# Patient Record
Sex: Male | Born: 1945
Health system: Southern US, Community
[De-identification: ages and names within clinical notes are randomized; demographics above are authoritative.]

## PROBLEM LIST (undated history)

## (undated) DIAGNOSIS — I10 Essential (primary) hypertension: Secondary | ICD-10-CM

## (undated) DIAGNOSIS — E785 Hyperlipidemia, unspecified: Secondary | ICD-10-CM

## (undated) DIAGNOSIS — N402 Nodular prostate without lower urinary tract symptoms: Secondary | ICD-10-CM

## (undated) DIAGNOSIS — K2270 Barrett's esophagus without dysplasia: Secondary | ICD-10-CM

## (undated) DIAGNOSIS — N200 Calculus of kidney: Secondary | ICD-10-CM

## (undated) DIAGNOSIS — K219 Gastro-esophageal reflux disease without esophagitis: Secondary | ICD-10-CM

## (undated) HISTORY — DX: Barrett's esophagus without dysplasia: K22.70

## (undated) HISTORY — PX: UPPER GASTROINTESTINAL ENDOSCOPY: SHX188

## (undated) HISTORY — PX: ESOPHAGOGASTRODUODENOSCOPY: SHX1529

## (undated) HISTORY — PX: COLONOSCOPY: SHX174

## (undated) HISTORY — DX: Nodular prostate without lower urinary tract symptoms: N40.2

## (undated) HISTORY — DX: Essential (primary) hypertension: I10

## (undated) HISTORY — DX: Gastro-esophageal reflux disease without esophagitis: K21.9

## (undated) HISTORY — DX: Calculus of kidney: N20.0

## (undated) HISTORY — DX: Hyperlipidemia, unspecified: E78.5

## (undated) HISTORY — PX: APPENDECTOMY: SHX54

---

## 2000-02-18 ENCOUNTER — Encounter: Payer: Self-pay | Admitting: Internal Medicine

## 2000-02-18 ENCOUNTER — Ambulatory Visit (HOSPITAL_COMMUNITY): Admission: RE | Admit: 2000-02-18 | Discharge: 2000-02-18 | Payer: Self-pay | Admitting: Gastroenterology

## 2001-10-24 ENCOUNTER — Encounter: Payer: Self-pay | Admitting: Urology

## 2001-10-24 ENCOUNTER — Emergency Department (HOSPITAL_COMMUNITY): Admission: EM | Admit: 2001-10-24 | Discharge: 2001-10-24 | Payer: Self-pay | Admitting: Emergency Medicine

## 2001-11-04 ENCOUNTER — Encounter: Payer: Self-pay | Admitting: Urology

## 2001-11-04 ENCOUNTER — Ambulatory Visit (HOSPITAL_BASED_OUTPATIENT_CLINIC_OR_DEPARTMENT_OTHER): Admission: RE | Admit: 2001-11-04 | Discharge: 2001-11-04 | Payer: Self-pay | Admitting: Urology

## 2004-01-22 ENCOUNTER — Inpatient Hospital Stay (HOSPITAL_BASED_OUTPATIENT_CLINIC_OR_DEPARTMENT_OTHER): Admission: RE | Admit: 2004-01-22 | Discharge: 2004-01-22 | Payer: Self-pay | Admitting: Cardiology

## 2004-04-05 ENCOUNTER — Encounter: Payer: Self-pay | Admitting: Gastroenterology

## 2004-04-09 ENCOUNTER — Inpatient Hospital Stay (HOSPITAL_COMMUNITY): Admission: EM | Admit: 2004-04-09 | Discharge: 2004-04-11 | Payer: Self-pay | Admitting: Emergency Medicine

## 2004-04-09 ENCOUNTER — Encounter (INDEPENDENT_AMBULATORY_CARE_PROVIDER_SITE_OTHER): Payer: Self-pay | Admitting: *Deleted

## 2004-04-09 ENCOUNTER — Ambulatory Visit: Payer: Self-pay | Admitting: Internal Medicine

## 2004-04-30 ENCOUNTER — Ambulatory Visit: Payer: Self-pay | Admitting: Gastroenterology

## 2004-09-03 ENCOUNTER — Ambulatory Visit: Payer: Self-pay | Admitting: Internal Medicine

## 2004-09-17 ENCOUNTER — Ambulatory Visit: Payer: Self-pay | Admitting: Internal Medicine

## 2005-03-26 ENCOUNTER — Ambulatory Visit: Payer: Self-pay | Admitting: Internal Medicine

## 2005-04-04 ENCOUNTER — Ambulatory Visit (HOSPITAL_BASED_OUTPATIENT_CLINIC_OR_DEPARTMENT_OTHER): Admission: RE | Admit: 2005-04-04 | Discharge: 2005-04-04 | Payer: Self-pay | Admitting: Urology

## 2005-04-04 ENCOUNTER — Ambulatory Visit (HOSPITAL_COMMUNITY): Admission: RE | Admit: 2005-04-04 | Discharge: 2005-04-04 | Payer: Self-pay | Admitting: Urology

## 2005-08-22 ENCOUNTER — Ambulatory Visit: Payer: Self-pay | Admitting: Internal Medicine

## 2006-04-10 ENCOUNTER — Ambulatory Visit: Payer: Self-pay | Admitting: Internal Medicine

## 2006-07-09 ENCOUNTER — Ambulatory Visit: Payer: Self-pay | Admitting: Internal Medicine

## 2006-07-09 LAB — CONVERTED CEMR LAB
ALT: 27 units/L (ref 0–40)
Basophils Absolute: 0 10*3/uL (ref 0.0–0.1)
Calcium: 9.4 mg/dL (ref 8.4–10.5)
Chloride: 108 meq/L (ref 96–112)
Creatinine, Ser: 0.9 mg/dL (ref 0.4–1.5)
Eosinophils Absolute: 0.1 10*3/uL (ref 0.0–0.6)
Eosinophils Relative: 1.8 % (ref 0.0–5.0)
GFR calc non Af Amer: 91 mL/min
HCT: 47 % (ref 39.0–52.0)
LDL Cholesterol: 65 mg/dL (ref 0–99)
MCHC: 33.8 g/dL (ref 30.0–36.0)
Neutrophils Relative %: 60 % (ref 43.0–77.0)
PSA: 0.79 ng/mL (ref 0.10–4.00)
Platelets: 248 10*3/uL (ref 150–400)
RBC: 5.11 M/uL (ref 4.22–5.81)
RDW: 12.4 % (ref 11.5–14.6)
Sodium: 140 meq/L (ref 135–145)
TSH: 1.18 microintl units/mL (ref 0.35–5.50)
Total CHOL/HDL Ratio: 3.5
Triglycerides: 114 mg/dL (ref 0–149)
WBC: 4.3 10*3/uL — ABNORMAL LOW (ref 4.5–10.5)

## 2006-07-31 IMAGING — CT CT PELVIS W/ CM
1 of 4 series · 14 of 32 positions shown, 19 images · IV contrast (omnipaque)
Comparison: none

CLINICAL DATA: Right lower quadrant pain.
 CT ABDOMEN AND PELVIS WITH CONTRAST ? 04/09/04 AT 4205 HOURS:
TECHNIQUE: 150 cc of Omnipaque 300.
 ABDOMEN:

[Series 3: — · axial · 0.74mm/px · z∈[-723,-288]mm · 14 of 99 slices shown, 19 images]
[im 6/99  soft-tissue]
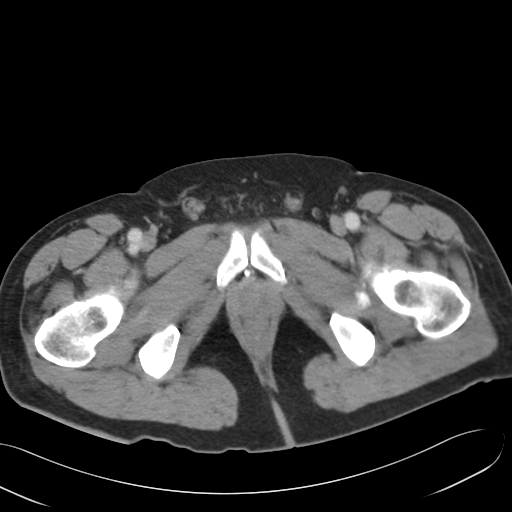
[im 6/99  bone]
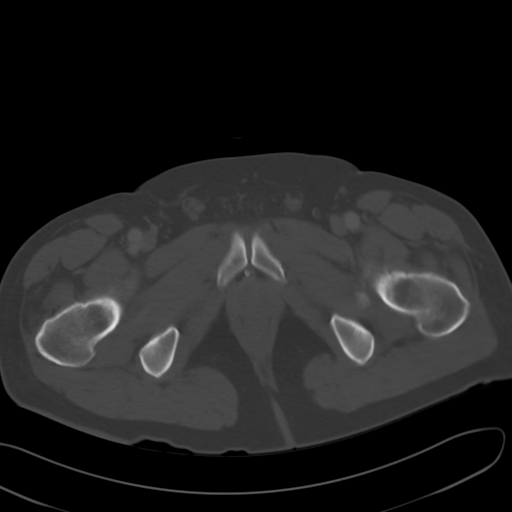
[im 16/99  soft-tissue]
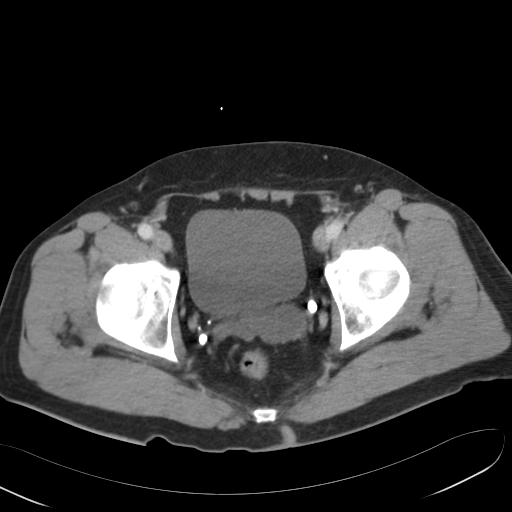
[im 21/99  soft-tissue]
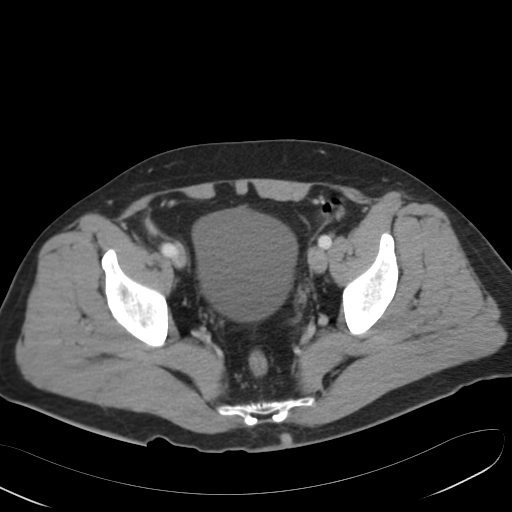
[im 26/99  soft-tissue]
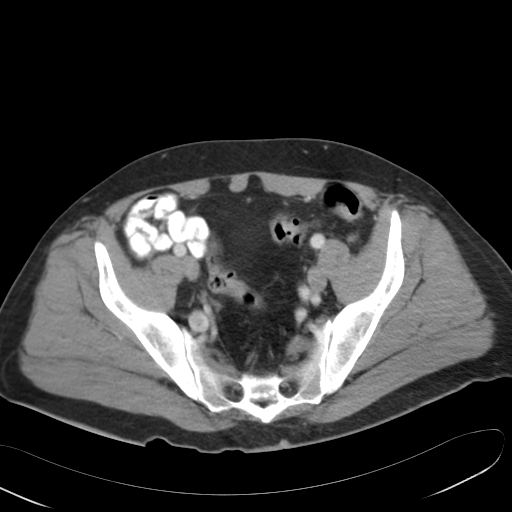
[im 37/99  soft-tissue]
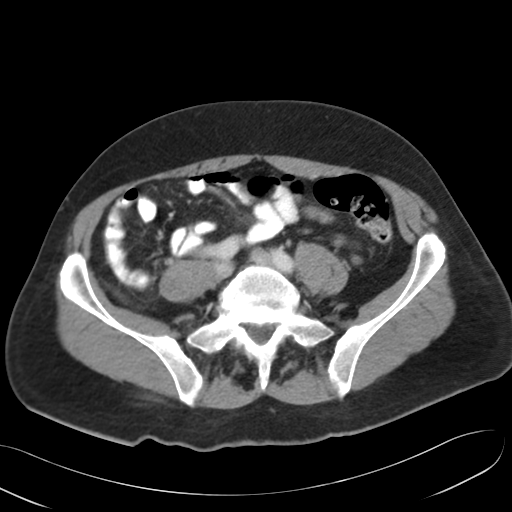
[im 42/99  soft-tissue]
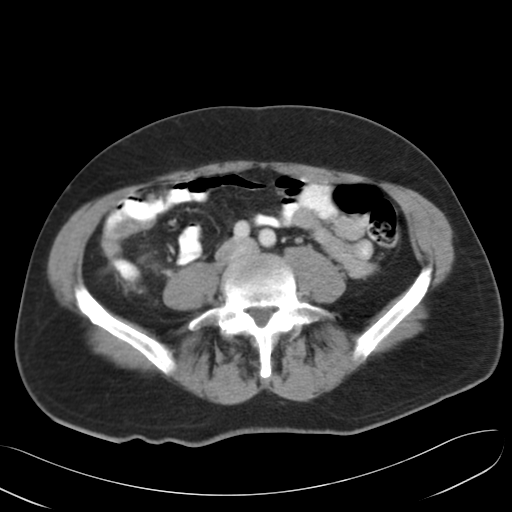
[im 52/99  soft-tissue]
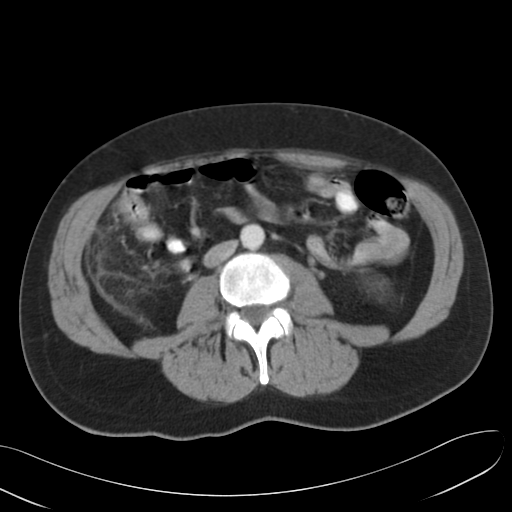
[im 57/99  soft-tissue]
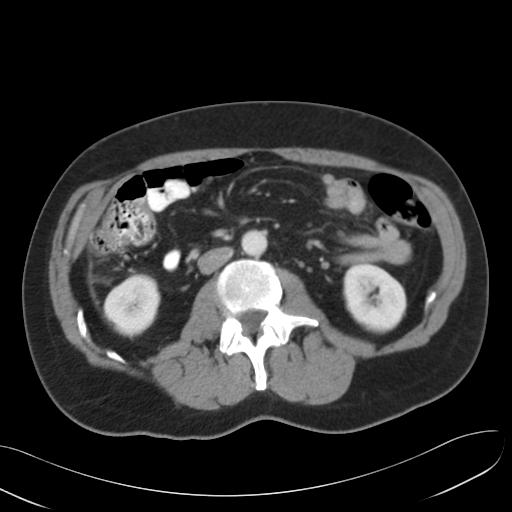
[im 62/99  soft-tissue]
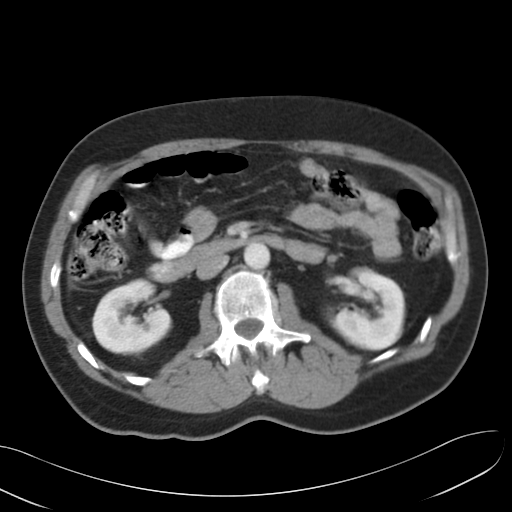
[im 62/99  bone]
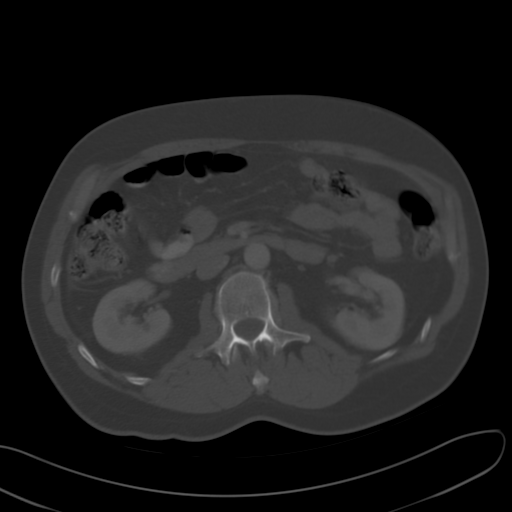
[im 73/99  soft-tissue]
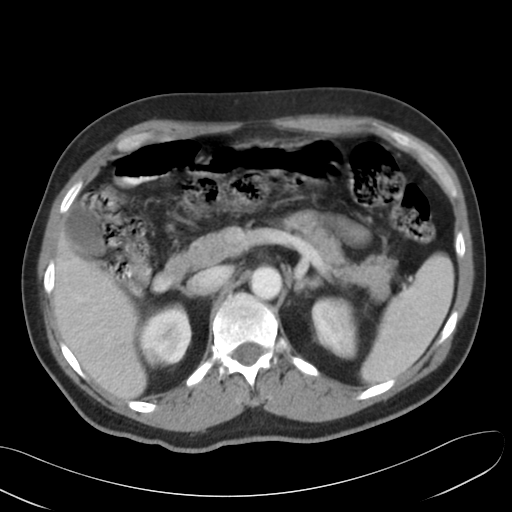
[im 78/99  soft-tissue]
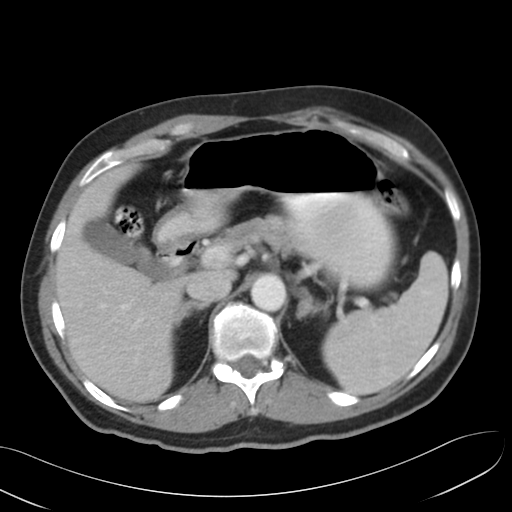
[im 78/99  lung]
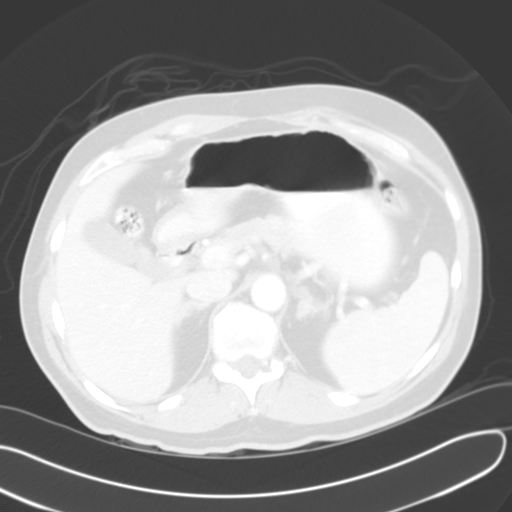
[im 83/99  soft-tissue]
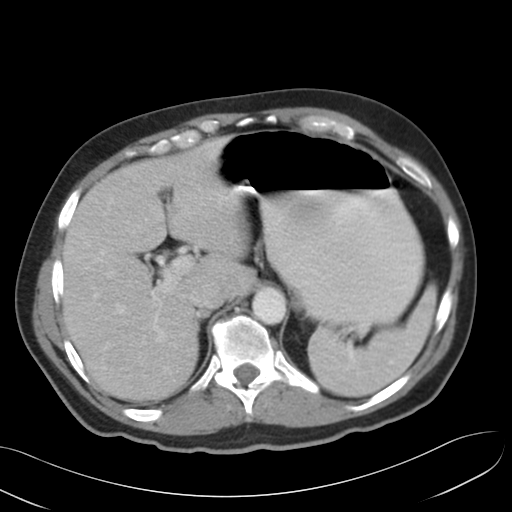
[im 83/99  lung]
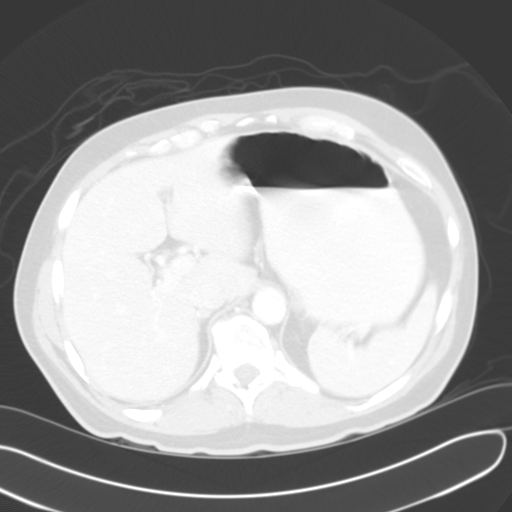
[im 88/99  lung]
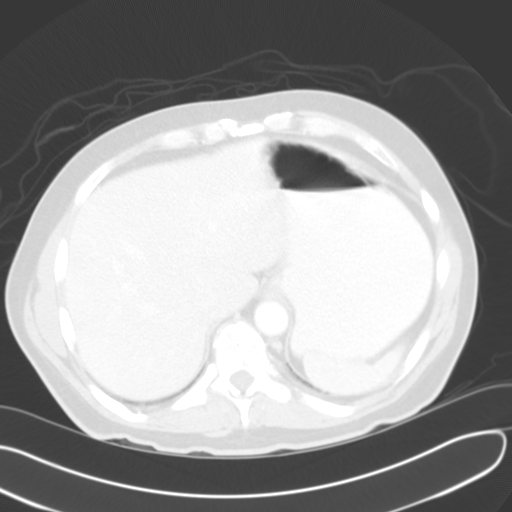
[im 93/99  soft-tissue]
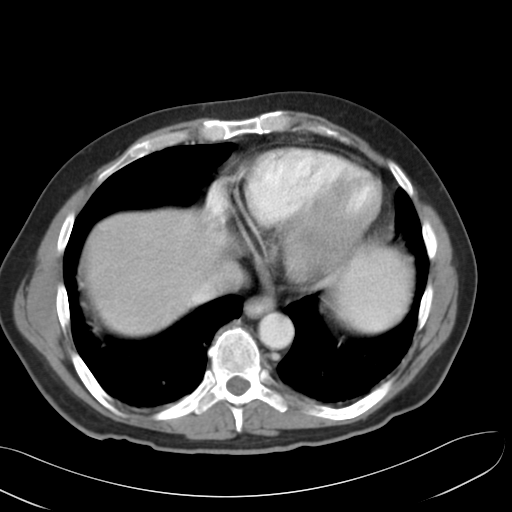
[im 93/99  lung]
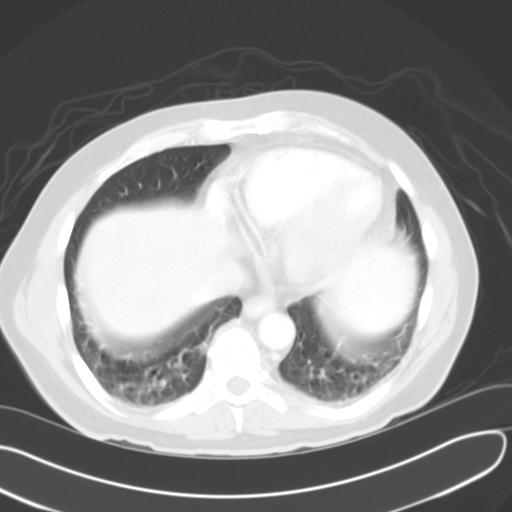

[14 of 32 positions shown; findings below may reference images not displayed]

FINDINGS: The appendix is abnormally thickened.  An appendicolith is noted.  The appendix measures 2 cm and is fluid-filled.  A fleck of gas is also present in the appendix.  Inflammatory change is seen in the adjacent fat.  No free fluid other than the fluid-filled appendix is identified.
 Bibasilar atelectasis is noted.  The liver, gallbladder, spleen, and pancreas are within normal limits.  A 3 mm calculus is seen in the upper pole of the right kidney.  There is no hydronephrosis or perinephric stranding.  A lobulated left adrenal nodule is present.  This is unchanged with comparison to the prior study.
IMPRESSION: 1.  Findings consistent with acute appendicitis.
 2.  Stable left adrenal nodule.
 3.  Nephrolithiasis of the right kidney.
 PELVIS:
FINDINGS: The bladder and prostate are within normal limits.  Prominence of the left seminal vesicles is stable.  Negative free fluid.  Negative abnormal adenopathy.
IMPRESSION: Stable prominent left seminal vesicles.

## 2006-09-30 ENCOUNTER — Ambulatory Visit: Payer: Self-pay | Admitting: Internal Medicine

## 2006-09-30 LAB — CONVERTED CEMR LAB
ALT: 30 units/L (ref 0–40)
AST: 22 units/L (ref 0–37)
Cholesterol: 139 mg/dL (ref 0–200)
Total CHOL/HDL Ratio: 4.1

## 2006-10-23 DIAGNOSIS — K2270 Barrett's esophagus without dysplasia: Secondary | ICD-10-CM | POA: Insufficient documentation

## 2006-11-25 ENCOUNTER — Ambulatory Visit: Payer: Self-pay | Admitting: Internal Medicine

## 2006-12-24 ENCOUNTER — Encounter: Payer: Self-pay | Admitting: Internal Medicine

## 2006-12-24 ENCOUNTER — Ambulatory Visit: Payer: Self-pay | Admitting: Internal Medicine

## 2007-01-18 ENCOUNTER — Encounter (INDEPENDENT_AMBULATORY_CARE_PROVIDER_SITE_OTHER): Payer: Self-pay | Admitting: *Deleted

## 2007-02-19 ENCOUNTER — Ambulatory Visit: Payer: Self-pay | Admitting: Internal Medicine

## 2007-02-19 DIAGNOSIS — E785 Hyperlipidemia, unspecified: Secondary | ICD-10-CM | POA: Insufficient documentation

## 2007-02-23 ENCOUNTER — Ambulatory Visit: Payer: Self-pay | Admitting: Internal Medicine

## 2007-02-25 LAB — CONVERTED CEMR LAB
AST: 22 units/L (ref 0–37)
Bilirubin, Direct: 0.1 mg/dL (ref 0.0–0.3)
Cholesterol: 166 mg/dL (ref 0–200)
HDL: 32.9 mg/dL — ABNORMAL LOW (ref >39.0)
Total Bilirubin: 0.8 mg/dL (ref 0.3–1.2)
Total CHOL/HDL Ratio: 5
Total Protein: 7 g/dL (ref 6.0–8.3)
Triglycerides: 179 mg/dL — ABNORMAL HIGH (ref 0–149)

## 2007-07-26 IMAGING — CR DG ABDOMEN 1V
1 series · 1 of 1 positions shown · non-contrast
Comparison: CT 12/08/03.

CLINICAL DATA: Right ureteral stone. 
 ABDOMEN ? 1 VIEW:

[view not recorded]
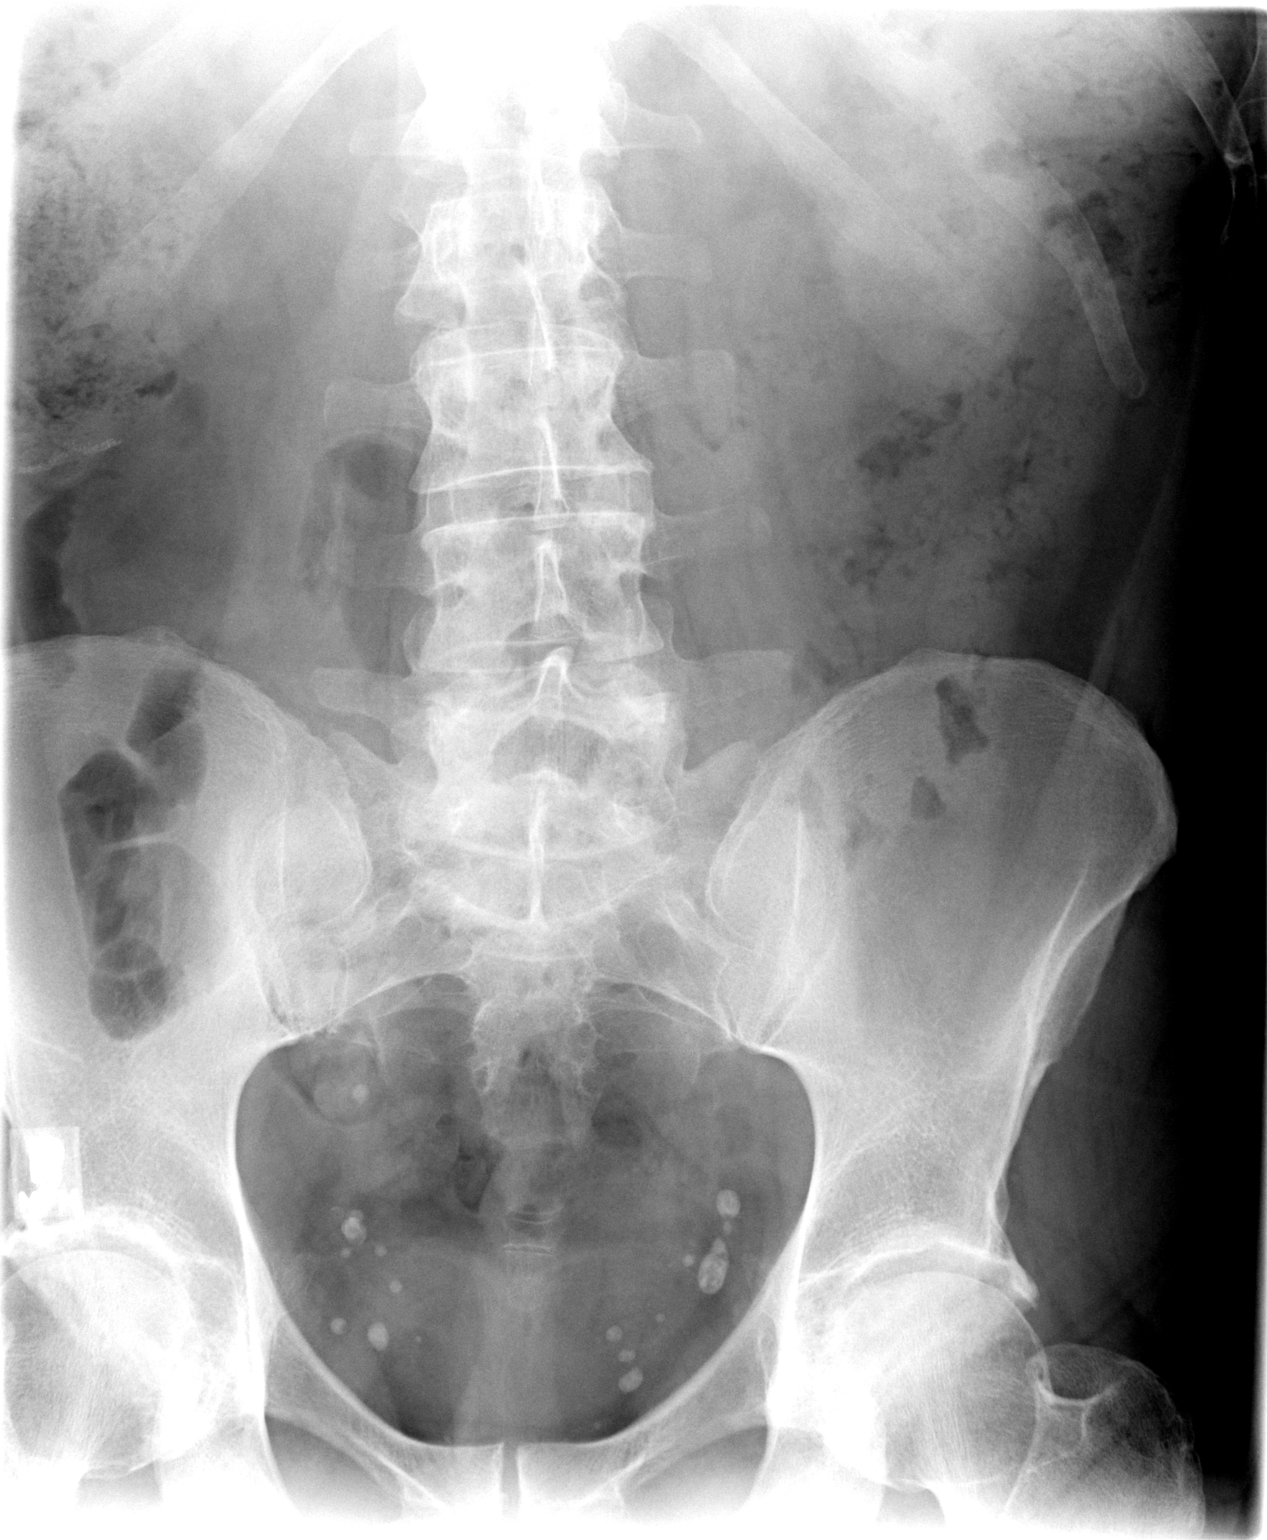

[1 of 1 positions shown; findings below may reference images not displayed]

FINDINGS: The bowel is nondilated.  There is retained stool in the colon bilaterally.  There is no bowel obstruction. 
 Multiple phleboliths are noted in the pelvis bilaterally.  There is a 5 mm round calcification just below the right SI joint which could be in the distal right ureter.  Correlation with CT or other imaging is suggested.  No acute bony abnormality.  There is degenerative change in both hips.
IMPRESSION: 1.  Constipation.  
 2.  5 mm calcification in the right pelvis which could be in the distal right ureter.  Correlation with symptoms and CT or other imaging is suggested.

## 2007-09-01 ENCOUNTER — Telehealth: Payer: Self-pay | Admitting: Internal Medicine

## 2007-09-07 ENCOUNTER — Ambulatory Visit: Payer: Self-pay | Admitting: Internal Medicine

## 2007-09-13 LAB — CONVERTED CEMR LAB
BUN: 17 mg/dL (ref 6–23)
CO2: 26 meq/L (ref 19–32)
Chloride: 109 meq/L (ref 96–112)
Cholesterol: 136 mg/dL (ref 0–200)
Creatinine, Ser: 0.9 mg/dL (ref 0.4–1.5)
GFR calc non Af Amer: 91 mL/min
Glucose, Bld: 122 mg/dL — ABNORMAL HIGH (ref 70–99)
LDL Cholesterol: 81 mg/dL (ref 0–99)
Potassium: 3.8 meq/L (ref 3.5–5.1)
Triglycerides: 110 mg/dL (ref 0–149)

## 2007-09-30 ENCOUNTER — Telehealth: Payer: Self-pay | Admitting: Internal Medicine

## 2007-10-11 DIAGNOSIS — I499 Cardiac arrhythmia, unspecified: Secondary | ICD-10-CM | POA: Insufficient documentation

## 2007-10-11 DIAGNOSIS — N200 Calculus of kidney: Secondary | ICD-10-CM | POA: Insufficient documentation

## 2008-02-04 ENCOUNTER — Ambulatory Visit: Payer: Self-pay | Admitting: Internal Medicine

## 2008-02-04 DIAGNOSIS — R7309 Other abnormal glucose: Secondary | ICD-10-CM | POA: Insufficient documentation

## 2008-02-04 DIAGNOSIS — I10 Essential (primary) hypertension: Secondary | ICD-10-CM

## 2008-02-04 HISTORY — DX: Essential (primary) hypertension: I10

## 2008-02-07 LAB — CONVERTED CEMR LAB
BUN: 14 mg/dL (ref 6–23)
Calcium: 9.6 mg/dL (ref 8.4–10.5)
Chloride: 108 meq/L (ref 96–112)
Cholesterol: 148 mg/dL (ref 0–200)
Creatinine, Ser: 0.9 mg/dL (ref 0.4–1.5)
GFR calc non Af Amer: 91 mL/min
HDL: 29.3 mg/dL — ABNORMAL LOW (ref >39.0)
LDL Cholesterol: 100 mg/dL — ABNORMAL HIGH (ref 0–99)
PSA: 0.5 ng/mL (ref 0.10–4.00)
Triglycerides: 95 mg/dL (ref 0–149)

## 2008-06-05 ENCOUNTER — Encounter (INDEPENDENT_AMBULATORY_CARE_PROVIDER_SITE_OTHER): Payer: Self-pay | Admitting: *Deleted

## 2008-06-19 ENCOUNTER — Ambulatory Visit: Payer: Self-pay | Admitting: Internal Medicine

## 2008-06-19 DIAGNOSIS — G4700 Insomnia, unspecified: Secondary | ICD-10-CM | POA: Insufficient documentation

## 2008-06-19 DIAGNOSIS — R229 Localized swelling, mass and lump, unspecified: Secondary | ICD-10-CM | POA: Insufficient documentation

## 2008-06-30 ENCOUNTER — Encounter: Payer: Self-pay | Admitting: Internal Medicine

## 2008-10-30 ENCOUNTER — Ambulatory Visit: Payer: Self-pay | Admitting: Internal Medicine

## 2008-11-02 LAB — CONVERTED CEMR LAB
Albumin: 4.6 g/dL (ref 3.5–5.2)
Basophils Relative: 0.1 % (ref 0.0–3.0)
CO2: 26 meq/L (ref 19–32)
Chloride: 108 meq/L (ref 96–112)
Cholesterol: 144 mg/dL (ref 0–200)
Eosinophils Absolute: 0.1 10*3/uL (ref 0.0–0.7)
Glucose, Bld: 103 mg/dL — ABNORMAL HIGH (ref 70–99)
Hemoglobin: 16.9 g/dL (ref 13.0–17.0)
Lymphs Abs: 1.1 10*3/uL (ref 0.7–4.0)
MCHC: 34.4 g/dL (ref 30.0–36.0)
MCV: 92.7 fL (ref 78.0–100.0)
Monocytes Absolute: 0.5 10*3/uL (ref 0.1–1.0)
Neutro Abs: 4.6 10*3/uL (ref 1.4–7.7)
RBC: 5.28 M/uL (ref 4.22–5.81)
Sodium: 141 meq/L (ref 135–145)
TSH: 1.07 microintl units/mL (ref 0.35–5.50)
Total CHOL/HDL Ratio: 4
Total Protein: 7.8 g/dL (ref 6.0–8.3)
Triglycerides: 114 mg/dL (ref 0.0–149.0)

## 2008-11-14 ENCOUNTER — Ambulatory Visit: Payer: Self-pay | Admitting: Internal Medicine

## 2008-11-16 ENCOUNTER — Encounter (INDEPENDENT_AMBULATORY_CARE_PROVIDER_SITE_OTHER): Payer: Self-pay | Admitting: *Deleted

## 2008-11-16 LAB — CONVERTED CEMR LAB
BUN: 23 mg/dL (ref 6–23)
CO2: 26 meq/L (ref 19–32)
Chloride: 114 meq/L — ABNORMAL HIGH (ref 96–112)
Creatinine, Ser: 1.1 mg/dL (ref 0.4–1.5)

## 2008-12-07 ENCOUNTER — Telehealth (INDEPENDENT_AMBULATORY_CARE_PROVIDER_SITE_OTHER): Payer: Self-pay | Admitting: *Deleted

## 2009-01-25 ENCOUNTER — Telehealth (INDEPENDENT_AMBULATORY_CARE_PROVIDER_SITE_OTHER): Payer: Self-pay | Admitting: *Deleted

## 2009-01-30 ENCOUNTER — Ambulatory Visit: Payer: Self-pay | Admitting: Internal Medicine

## 2009-01-30 DIAGNOSIS — M722 Plantar fascial fibromatosis: Secondary | ICD-10-CM | POA: Insufficient documentation

## 2009-01-31 ENCOUNTER — Encounter (INDEPENDENT_AMBULATORY_CARE_PROVIDER_SITE_OTHER): Payer: Self-pay | Admitting: *Deleted

## 2009-02-26 ENCOUNTER — Ambulatory Visit: Payer: Self-pay | Admitting: Internal Medicine

## 2009-03-21 ENCOUNTER — Encounter
Admission: RE | Admit: 2009-03-21 | Discharge: 2009-05-18 | Payer: Self-pay | Admitting: Physical Medicine & Rehabilitation

## 2009-03-23 ENCOUNTER — Ambulatory Visit: Payer: Self-pay | Admitting: Physical Medicine & Rehabilitation

## 2009-04-05 ENCOUNTER — Encounter
Admission: RE | Admit: 2009-04-05 | Discharge: 2009-05-23 | Payer: Self-pay | Admitting: Physical Medicine & Rehabilitation

## 2009-05-17 ENCOUNTER — Ambulatory Visit: Payer: Self-pay | Admitting: Physical Medicine & Rehabilitation

## 2009-11-19 ENCOUNTER — Encounter (INDEPENDENT_AMBULATORY_CARE_PROVIDER_SITE_OTHER): Payer: Self-pay | Admitting: *Deleted

## 2009-11-26 ENCOUNTER — Telehealth: Payer: Self-pay | Admitting: Internal Medicine

## 2009-11-27 ENCOUNTER — Encounter (INDEPENDENT_AMBULATORY_CARE_PROVIDER_SITE_OTHER): Payer: Self-pay | Admitting: *Deleted

## 2010-01-24 ENCOUNTER — Encounter (INDEPENDENT_AMBULATORY_CARE_PROVIDER_SITE_OTHER): Payer: Self-pay

## 2010-01-25 ENCOUNTER — Ambulatory Visit: Payer: Self-pay | Admitting: Internal Medicine

## 2010-01-29 ENCOUNTER — Ambulatory Visit: Payer: Self-pay | Admitting: Internal Medicine

## 2010-02-01 LAB — CONVERTED CEMR LAB
Albumin: 4.3 g/dL (ref 3.5–5.2)
Alkaline Phosphatase: 78 units/L (ref 39–117)
Basophils Absolute: 0 10*3/uL (ref 0.0–0.1)
CO2: 23 meq/L (ref 19–32)
Calcium: 9 mg/dL (ref 8.4–10.5)
Cholesterol: 136 mg/dL (ref 0–200)
Eosinophils Absolute: 0.1 10*3/uL (ref 0.0–0.7)
Glucose, Bld: 93 mg/dL (ref 70–99)
HDL: 32.8 mg/dL — ABNORMAL LOW (ref >39.00)
Hemoglobin: 15.4 g/dL (ref 13.0–17.0)
Lymphocytes Relative: 26 % (ref 12.0–46.0)
Lymphs Abs: 1.1 10*3/uL (ref 0.7–4.0)
MCHC: 34.3 g/dL (ref 30.0–36.0)
Neutro Abs: 2.6 10*3/uL (ref 1.4–7.7)
RDW: 13.8 % (ref 11.5–14.6)
Sodium: 141 meq/L (ref 135–145)
Triglycerides: 93 mg/dL (ref 0.0–149.0)
VLDL: 18.6 mg/dL (ref 0.0–40.0)

## 2010-02-21 ENCOUNTER — Ambulatory Visit: Payer: Self-pay | Admitting: Internal Medicine

## 2010-02-21 LAB — HM COLONOSCOPY

## 2010-02-26 ENCOUNTER — Encounter: Payer: Self-pay | Admitting: Internal Medicine

## 2010-06-04 ENCOUNTER — Ambulatory Visit: Payer: Self-pay | Admitting: Internal Medicine

## 2010-07-09 NOTE — Letter (Signed)
Summary: [facility] Instructions  [facility]  [address] [place], [place] [address]   Phone: [phone]  Fax: [phone]       [name]    22-Oct-1945    MRN: [id number]        Procedure Day /Date:  Thursday 02/21/2010     Arrival Time: 8:30 am      Procedure Time: 9:30 am     Location of Procedure:                    _ x_  [facility] (4th Floor)                        PREPARATION FOR COLONOSCOPY WITH MOVIPREP   Starting 5 days prior to your procedure Saturday 9/10 do not eat nuts, seeds, popcorn, corn, beans, peas,  salads, or any raw vegetables.  Do not take any fiber supplements (e.g. Metamucil, Citrucel, and Benefiber).  THE DAY BEFORE YOUR PROCEDURE         DATE: Wednesday 9/14 1.  Drink clear liquids the entire day-NO SOLID FOOD  2.  Do not drink anything colored red or purple.  Avoid juices with pulp.  No orange juice.  3.  Drink at least 64 oz. (8 glasses) of fluid/clear liquids during the day to prevent dehydration and help the prep work efficiently.  CLEAR LIQUIDS INCLUDE: Water Jello Ice Popsicles Tea (sugar ok, no milk/cream) Powdered fruit flavored drinks Coffee (sugar ok, no milk/cream) Gatorade Juice: apple, white grape, white cranberry  Lemonade Clear bullion, consomm, broth Carbonated beverages (any kind) Strained chicken noodle soup Hard Candy                             4.  In the morning, mix first dose of MoviPrep solution:    Empty 1 Pouch A and 1 Pouch B into the disposable container    Add lukewarm drinking water to the top line of the container. Mix to dissolve    Refrigerate (mixed solution should be used within 24 hrs)  5.  Begin drinking the prep at 5:00 p.m. The MoviPrep container is divided by 4 marks.   Every 15 minutes drink the solution down to the next mark (approximately 8 oz) until the full liter is complete.   6.  Follow completed prep with 16 oz of clear liquid of your choice  (Nothing red or purple).  Continue to drink clear liquids until bedtime.  7.  Before going to bed, mix second dose of MoviPrep solution:    Empty 1 Pouch A and 1 Pouch B into the disposable container    Add lukewarm drinking water to the top line of the container. Mix to dissolve    Refrigerate  THE DAY OF YOUR PROCEDURE      DATE: Thursday 9/15  Beginning at 4:30 a.m. (5 hours before procedure):         1. Every 15 minutes, drink the solution down to the next mark (approx 8 oz) until the full liter is complete.  2. Follow completed prep with 16 oz. of clear liquid of your choice.    3. You may drink clear liquids until 7:30 am  (2 HOURS BEFORE PROCEDURE).   MEDICATION INSTRUCTIONS  Unless otherwise instructed, you should take regular prescription medications with a small sip of water   as early as possible the morning of  your procedure.         OTHER INSTRUCTIONS  You will need a responsible adult at least 65 years of age to accompany you and drive you home.   This person must remain in the waiting room during your procedure.  Wear loose fitting clothing that is easily removed.  Leave jewelry and other valuables at home.  However, you may wish to bring a book to read or  an iPod/MP3 player to listen to music as you wait for your procedure to start.  Remove all body piercing jewelry and leave at home.  Total time from sign-in until discharge is approximately 2-3 hours.  You should go home directly after your procedure and rest.  You can resume normal activities the  day after your procedure.  The day of your procedure you should not:   Drive   Make legal decisions   Operate machinery   Drink alcohol   Return to work  You will receive specific instructions about eating, activities and medications before you leave.    The above instructions have been reviewed and explained to me by   Ulis Rias RN  January 29, 2010 3:15 PM     I fully understand and  can verbalize these instructions _____________________________ Date _________

## 2010-07-09 NOTE — Letter (Signed)
Summary: Primary Care Appointment Letter  [facility]  [address] [place], [place] [address]   Phone: [phone]  Fax: [phone]    11/19/2009 MRN: [id number]  TORRIN CRIHFIELD [address] [place], [place]  [address]  Dear Mr. Daryll Brod,   Your Primary Care Physician Kathryn E. Paz MD has indicated that:    ___X____it is time to schedule an appointment.    _______you missed your appointment on______ and need to call and          reschedule.   __X___you need to have lab work done.    _______you need to schedule an appointment discuss lab or test results.    _______you need to call to reschedule your appointment that is                       scheduled on _________.     Please call our office as soon as possible. Our phone number is [phone]. Please press option 1. Our office is open 8a-12noon and 1p-5p, Monday through Friday.     Thank you,    [place] [facility] Scheduler

## 2010-07-09 NOTE — Letter (Signed)
Summary: Previsit letter  [facility]  [address] [place], [place] [address]   Phone: [phone]  Fax: [phone]       11/27/2009 MRN: [id number]  [name] [address] [place], [place]  [address]  Dear [name],  Welcome to the Gastroenterology Division at [facility].    You are scheduled to see a nurse for your pre-procedure visit on January 29, 2010 at 3:00 pm on the 3rd floor at [facility], [address]  We ask that you try to arrive at our office 15 minutes prior to your appointment time to allow for check-in.  Your nurse visit will consist of discussing your medical and surgical history, your immediate family medical history, and your medications.    Please bring a complete list of all your medications or, if you prefer, bring the medication bottles and we will list them.  We will need to be aware of both prescribed and over the counter drugs.  We will need to know exact dosage information as well.  If you are on blood thinners (Coumadin, Plavix, Aggrenox, Ticlid, etc.) please call our office today/prior to your appointment, as we need to consult with your physician about holding your medication.   Please be prepared to read and sign documents such as consent forms, a financial agreement, and acknowledgement forms.  If necessary, and with your consent, a friend or relative is welcome to sit-in on the nurse visit with you.  Please bring your insurance card so that we may make a copy of it.  If your insurance requires a referral to see a specialist, please bring your referral form from your primary care physician.  No co-pay is required for this nurse visit.     If you cannot keep your appointment, please call [phone] to cancel or reschedule prior to your appointment date.  This allows [place] the opportunity to schedule an appointment for another patient in need of care.    Thank you for choosing [facility] for  your medical needs.  We appreciate the opportunity to care for you.  Please visit [place] at our website  to learn more about our practice.                     Sincerely.                                                                                                                   The Gastroenterology Division

## 2010-07-09 NOTE — Procedures (Signed)
Summary: Upper Endoscopy  Patient: [name] Note: All result statuses are Final unless otherwise noted.  Tests: (1) Upper Endoscopy (EGD)   EGD Upper Endoscopy       DONE     [facility]     [address]     [place], [place]  [address]           ENDOSCOPY PROCEDURE REPORT           PATIENT:  [name], [name]  MR#:  [id number]     BIRTHDATE:  04-Nov-1945, 64 yrs. old  GENDER:  male           ENDOSCOPIST:  Iva Boop, MD, [facility]           PROCEDURE DATE:  02/21/2010     PROCEDURE:  EGD with biopsy     ASA CLASS:  Class II     INDICATIONS:  h/o Barrett's Esophagus, surveillance last EGD 2008                 MEDICATIONS:   Fentanyl 50 mcg IV, Versed 5 mg IV     TOPICAL ANESTHETIC:  Exactacain Spray           DESCRIPTION OF PROCEDURE:   After the risks benefits and     alternatives of the procedure were thoroughly explained, informed     consent was obtained.  The LB GIF-H180 [id number] endoscope was     introduced through the mouth and advanced to the second portion of     the duodenum, without limitations.  The instrument was slowly     withdrawn as the mucosa was fully examined.     <<PROCEDUREIMAGES>>           Barrett's esophagus was found in the distal esophagus. 40-41 cm,     three tongues and 2 islands. No ulcers, nodules or inflammation.     Multiple biopsies were obtained and sent to pathology.  Abnormal     appearing mucosa in the bulb and descending duodenum. Red spots.     Otherwise the examination was normal.    Retroflexed views revealed     no abnormalities.    The scope was then withdrawn from the patient     and the procedure completed.           COMPLICATIONS:  None           ENDOSCOPIC IMPRESSION:     1) Barrett's esophagus in the distal esophagus - short segment     2) Abnormal mucosa in the bulb/descending duodenum - red spots     likely from colonoscopy prep     3) Otherwise normal examination     RECOMMENDATIONS:     1) Await  biopsy results           REPEAT EXAM:  In for EGD, pending biopsy results.           Iva Boop, MD, [facility]           CC:  The Patient           n.     eSIGNED:   Iva Boop at 02/21/2010 10:25 AM           Rocky Morel, [id number]  Note: An exclamation mark (!) indicates a result that was not dispersed into the flowsheet. Document Creation Date: 02/21/2010 10:26 AM _______________________________________________________________________  (1) Order result status: Final Collection or observation date-time: 02/21/2010 10:06 Requested date-time:  Receipt  date-time:  Reported date-time:  Referring Physician:   Ordering Physician: Stan Head [phone]) Specimen Source:  Source: Launa Grill Order Number: [id number] Lab site:   Appended Document: Upper Endoscopy     Procedures Next Due Date:    EGD: 02/2015

## 2010-07-09 NOTE — Assessment & Plan Note (Signed)
Summary: CPX/FASTING//KN   Vital Signs:  Patient profile:   65 year old male Height:      70.5 inches Weight:      225.13 pounds BMI:     31.96 Pulse rate:   83 / minute Pulse rhythm:   regular BP sitting:   128 / 82  (left arm) Cuff size:   large  Vitals Entered By: Army Fossa CMA (January 25, 2010 8:34 AM) CC: CPX: Fasting Comments Colonoscopy is due Sept 13   History of Present Illness: CPX plantar fasciitis eventually resolved 3 months ago had his flight  physical last week, everything went well   Preventive Screening-Counseling & Management  Caffeine-Diet-Exercise     Does Patient Exercise: yes     Type of exercise: walking  Current Medications (verified): 1)  Zocor 40 Mg  Tabs (Simvastatin) .Marland Kitchen.. 1 1/2 By Mouth Once Daily *office Visit& Lab Due Now* 2)  Prevacid 30 Mg  Cpdr (Lansoprazole) .Marland Kitchen.. 1 Qd 3)  Zolpidem Tartrate 10 Mg Tabs (Zolpidem Tartrate) .... 1/2 To 1 By Mouth At Bedtime As Needed Insomnia 4)  Micardis 40 Mg Tabs (Telmisartan) .... 1/2 Tab A Day  Allergies (verified): No Known Drug Allergies  Past History:  Past Medical History: Reviewed history from 11/27/2009 and no changes required. Hyperlipidemia Barretts Esophagus Kidney Stones  Past Surgical History: Reviewed history from 02/04/2008 and no changes required. Appendectomy  Family History: Reviewed history from 10/30/2008 and no changes required. 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 CV:  Denies chest pain or discomfort, palpitations, and swelling of feet. Resp:  Denies cough and shortness of breath. GI:  Denies bloody stools, diarrhea, and nausea; no  GERD symptoms  no dysphagia . GU:  Denies dysuria, hematuria, urinary frequency, and urinary hesitancy. Psych:  Denies anxiety and depression.  Physical Exam  General:  alert, well-developed, and overweight-appearing.   Neck:  no masses, no thyromegaly, and normal carotid upstroke.   Lungs:  normal respiratory effort, no intercostal retractions, no accessory muscle use, and normal breath sounds.   Heart:  normal rate, regular rhythm, and no murmur.   Abdomen:  soft, non-tender, no distention, no masses, no guarding, and no rigidity.   Rectal:  No external abnormalities noted. Normal sphincter tone. No rectal masses or tenderness. Prostate:  Prostate gland firm and smooth, no enlargement, nodularity, tenderness, mass, asymmetry or induration. Extremities:  no pretibial edema bilaterally  Neurologic:  alert & oriented X3, strength normal in all extremities, and gait normal.   Psych:  Cognition and judgment appear intact. Alert and cooperative with normal attention span and concentration.  not anxious appearing and not depressed appearing.     Impression & Recommendations:  Problem # 1:  HEALTH SCREENING (ICD-V70.0) Td 08 pneumonia shot 06 had a Cscope 2001, to have a repeat colonoscopy soon patient is exercising more now that plantar fasciitis resolved. Encouraged to eat well and lose some weight. Information about diet provided  has EKGs done  routinely at the time of his flight physicals Followup in one year as long as he feels well and his BP is stable  Orders: Venipuncture ([address]) TLB-BMP (Basic Metabolic Panel-BMET) (80048-METABOL) TLB-CBC Platelet -  w/Differential (85025-CBCD) TLB-Hepatic/Liver Function Pnl (80076-HEPATIC) TLB-Lipid Panel (80061-LIPID) TLB-TSH (Thyroid Stimulating Hormone) (84443-TSH) TLB-PSA (Prostate Specific Antigen) (84153-PSA) Specimen Handling ([address])  Problem # 2:  BARRETT'S ESOPHAGUS (ICD-530.85) to have a repeated EGD at the time of the  colonoscopy  Complete Medication List: 1)  Zocor 40 Mg Tabs (Simvastatin) .Marland Kitchen.. 1 1/2 by mouth once daily 2)  Prevacid 30 Mg Cpdr (Lansoprazole) .Marland Kitchen.. 1 qd 3)  Zolpidem Tartrate 10 Mg Tabs (Zolpidem tartrate) .... 1/2 to 1 by mouth at bedtime as needed insomnia 4)  Micardis 40 Mg Tabs (Telmisartan) .... 1/2 tab a day  Patient Instructions: 1)  Please schedule a follow-up appointment in 1 year.  2)  Check your blood pressure 2 or 3 times a month. If it is more than 140/85 consistently,please let us know  Prescriptions: ZOLPIDEM TARTRATE 10 MG TABS (ZOLPIDEM TARTRATE) 1/2 to 1 by mouth at bedtime as needed insomnia  #30 x 0   Entered and Authorized by:   Nolon Rod. Paz MD   Signed by:   Nolon Rod. Paz MD on 01/25/2010   Method used:   Print then Give to Patient   RxID:   [id number] MICARDIS 40 MG TABS (TELMISARTAN) 1/2 tab a day  #90 x 3   Entered and Authorized by:   Nolon Rod. Paz MD   Signed by:   Nolon Rod. Paz MD on 01/25/2010   Method used:   Electronically to        [id number]* (retail)       [address]       [place], [place]  [address]       Ph: [phone]       Fax: [phone]   RxID:   [id number] PREVACID 30 MG  CPDR (LANSOPRAZOLE) 1 qd  #90 Each x 3   Entered and Authorized by:   Nolon Rod. Paz MD   Signed by:   Nolon Rod. Paz MD on 01/25/2010   Method used:   Electronically to        [id number]* (retail)       [address]       [place], [place]  [address]       Ph: [phone]       Fax: [phone]   RxID:   [id number] ZOCOR 40 MG  TABS (SIMVASTATIN) 1 1/2 by mouth once daily  #130 x 3   Entered and Authorized by:   Nolon Rod. Paz MD   Signed by:   Nolon Rod. Paz MD on 01/25/2010   Method used:   Electronically to        [id number]* (retail)       [address]       [place], [place]  [address]       Ph:  [phone]       Fax: [phone]   RxID:   [id number]    Risk Factors:  Exercise:  yes    Type:  walking

## 2010-07-09 NOTE — Letter (Signed)
Summary: Patient Notice-Barrett's [facility]  [address] [place], [place] [address]   Phone: [phone]  Fax: [phone]        February 26, 2010 MRN: [id number]    [name] [address] [place], [place]  [address]    Dear Mr. Stelzner,  I am pleased to inform you that the biopsies taken during your recent endoscopic examination did not show any evidence of cancer upon pathologic examination.  However, your biopsies indicate you still have a condition known as Barrett's esophagus. While not cancer, it is pre-cancerous (can progress to cancer) and needs to be monitored with repeat endoscopic examination and biopsies.  Fortunately, it is quite rare that this develops into cancer, but careful monitoring of the condition along with taking your medication as prescribed is important in reducing the risk of developing cancer.  It is my recommendation that you have a repeat upper gastrointestinal endoscopic examination in 5 years.  Please call us if you have or develop heartburn, reflux symptoms, any swallowing problems, or if you have questions about your condition that have not been fully answered at this time.   Sincerely,  Iva Boop MD, [facility]  This letter has been electronically signed by your physician.  Appended Document: Patient Notice-Barrett's Esopghagus letter mailed

## 2010-07-09 NOTE — Op Note (Signed)
Summary: Colonoscopy: Dr. Kinnie Scales: Normal                         [facility]  Patient:    [name], [name]                      MRN: [id number] Proc. Date: 02/18/00 Adm. Date:  [id number] Attending:  Deneen Harts CCJamas Lav, M.D.   Procedure Report  PROCEDURE:  Colonoscopy.  INDICATION:  Colorectal neoplasia surveillance.  DESCRIPTION OF PROCEDURE:  After reviewing the nature of the procedure with the patient including potential risks and complications, and after discussing alternative methods of diagnosis and treatment, informed consent was signed.  The patient was premedicated receiving IV sedation totalling Versed 8 mg, fentanyl 100 mcg administered in divided doses prior to and during the course of the procedure.  Using an Olympus PCF-140L pediatric video colonoscope, rectum was intubated after normal digital examination.  The scope was advanced around the entire length of the colon without difficulty, reaching the cecum which was identified by the appendiceal orifice and ileocecal valve.  Preparation was good throughout.  The scope was slowly withdrawn with careful inspection of the entire colon in a retrograde manner.  There was no evidence of neoplasia.  No mucosal inflammation, erosion, ulceration.  No diverticular disease or vascular abnormality.  Retroflex view in the vault was normal.  The colon was decompressed and scope withdrawn.  The patient tolerated the procedure without difficulty being maintained on Datascope monitor and low-flow oxygen throughout.  Returned to recovery in stable condition.  Time 2, technical 1, preparation 2, total score = 5.  ASSESSMENT:  Normal colonoscopy.  RECOMMENDATIONS: 1. Annual Hemoccults per Dr. Suezanne Jacquet. 2. Colonoscopy - 10 years. DD:  02/18/00 TD:  02/19/00 Job: [id number]

## 2010-07-09 NOTE — Miscellaneous (Signed)
Summary: Lec previsit  Clinical Lists Changes  Medications: Added new medication of MOVIPREP 100 GM  SOLR (PEG-KCL-NACL-NASULF-NA ASC-C) As per prep instructions. - Signed Rx of MOVIPREP 100 GM  SOLR (PEG-KCL-NACL-NASULF-NA ASC-C) As per prep instructions.;  #1 x 0;  Signed;  Entered by: Ulis Rias RN;  Authorized by: Iva Boop MD, FACG;  Method used: Electronically to [id number]*, [address] [place], [place], [place]  [address], Ph: [phone], Fax: [phone] Observations: Added new observation of NKA: T (01/29/2010 14:55)    Prescriptions: MOVIPREP 100 GM  SOLR (PEG-KCL-NACL-NASULF-NA ASC-C) As per prep instructions.  #1 x 0   Entered by:   Ulis Rias RN   Authorized by:   Iva Boop MD, [facility]   Signed by:   Ulis Rias RN on 01/29/2010   Method used:   Electronically to        [facility] #[id number]* (retail)       [address]       [place], [place]  [address]       Ph: [phone]       Fax: [phone]   RxID:   [id number]

## 2010-07-09 NOTE — Progress Notes (Signed)
Summary: Schedule Recall EGD/COLON   Phone Note Call from Patient Call back at Home Phone [phone] Call back at [phone]   Caller: wife, [name] Call For: Dr. Leone Payor Reason for Call: Talk to Nurse Summary of Call: pt would like to sch COL... last COL was with Dr. Kinnie Scales in September 2001... not having any GI complaints... no blood thinners other than Asprin, not diabetic... does Dr. Leone Payor approve given pt's history? Initial call taken by: Vallarie Mare,  November 26, 2009 11:31 AM  Follow-up for Phone Call        I spoke to the pt's wife and advised her that according to Dr. Marvell Fuller last office note, [name] is due for a colonoscopy.  Also advised her the [name] is also due for a follow up EGD.  Chart is in Dr. Marvell Fuller office for review for EGD with an additional note by Dr. Leone Payor to check EMR.  Advised pt we would hold off for now to schedule and I would make sure with Dr. Leone Payor that we should schedule for a double.  [name] is aware Dr. Leone Payor on vacation and she may not get a return call this week. Follow-up by: Francee Piccolo CMA Duncan Dull),  November 26, 2009 4:03 PM  Additional Follow-up for Phone Call Additional follow up Details #1::        schedule for colonoscopy also, v76.51 Additional Follow-up by: Iva Boop MD, Clementeen Graham,  November 27, 2009 6:57 AM    Additional Follow-up for Phone Call Additional follow up Details #2::    Appt scheduled for 02/21/10.  Pt wife would like after 02/17/10 for insurance purposes.  Previsit letter mailed.  Procedures/path reports pasted into EMR. Follow-up by: Francee Piccolo CMA Duncan Dull),  November 27, 2009 2:41 PM

## 2010-07-09 NOTE — Procedures (Signed)
Summary: EGD: Barrett's   EGD  Procedure date:  04/05/2004  Findings:      Location: [facility]  Findings: Barrett's   Procedures Next Due Date:    EGD: 04/2006  Patient Name: [name], [name] MRN: [id number] Procedure Procedures: Panendoscopy (EGD) CPT: 43235.    with biopsy(s)/brushing(s). CPT: D1846139.    with esophageal dilation. CPT: G9296129.  Personnel: Endoscopist: Vania Rea. Jarold Motto, MD.  Exam Location: Exam performed in Outpatient Clinic. Outpatient  Patient Consent: Procedure, Alternatives, Risks and Benefits discussed, consent obtained, from patient. Consent was obtained by the RN.  Indications Symptoms: Reflux symptoms  Comments: Globus sensation in throat area. History  Current Medications: Patient is not currently taking Coumadin.  Pre-Exam Physical: Performed Apr 05, 2004  Cardio-pulmonary exam, Abdominal exam, Extremity exam, Mental status exam WNL.  Exam Exam Info: Maximum depth of insertion Duodenum, intended Duodenum. Patient position: on left side. Duration of exam: 15 minutes. Vocal cords visualized. Gastric retroflexion performed. Images taken. ASA Classification: I. Tolerance: excellent.  Sedation Meds: Patient assessed and found to be appropriate for moderate (conscious) sedation. Fentanyl 75 mcg. given IV. Versed 8 mg. given IV. Cetacaine Spray 2 sprays given aerosolized.  Monitoring: BP and pulse monitoring done. Oximetry used. Supplemental O2 given at 2 Liters.  Instrument(s): GIF 160. Serial [id number].   Findings - Normal: Proximal Esophagus to Mid Esophagus.  - HIATAL HERNIA: Prolapsing, 4 cms. in length. ICD9: Hernia, Hiatal: 553.3. - OTHER FINDING: in Distal Esophagus. Biopsy/Other Finding taken. Comments: Irregular Z-line.Marland KitchenMarland KitchenR/O Barrett's mucosa.  - Dilation: Fundus. for dysphagia without stricture. Maloney dilator used, Diameter: 54 F, No Resistance, No Heme present on extraction. 1  total dilators used.  Patient tolerance excellent. Outcome: successful.  - Normal: Fundus to Duodenal 2nd Portion. Not Seen: Ulcer. Mucosal abnormality. AVM's. Foreign body.   Assessment  Diagnoses: 553.3: Hernia, Hiatal. GERD.   Comments: R/O Barrett's mucosa. Events  Unplanned Intervention: No unplanned interventions were required.  Plans Medication(s): PPI: Lansoprazole/Prevacid 30 mg QAM, starting Apr 05, 2004 for 4 wks.   Patient Education: Patient given standard instructions for: Reflux.  Disposition: After procedure patient sent to recovery. After recovery patient sent home.  Scheduling: Await pathology to schedule patient. Clinic Visit, to Vania Rea. Jarold Motto, MD, around May 06, 2004.   This report was created from the original endoscopy report, which was reviewed and signed by the above listed endoscopist.

## 2010-07-09 NOTE — Procedures (Signed)
Summary: Colonoscopy  Patient: [name] Note: All result statuses are Final unless otherwise noted.  Tests: (1) Colonoscopy (COL)   COL Colonoscopy           DONE     [facility]     [address]     [place], [place]  [address]           COLONOSCOPY PROCEDURE REPORT           PATIENT:  [name], [name]  MR#:  [id number]     BIRTHDATE:  06-Apr-1946, 64 yrs. old  GENDER:  male     ENDOSCOPIST:  Iva Boop, MD, [facility]           PROCEDURE DATE:  02/21/2010     PROCEDURE:  Colonoscopy 62130     ASA CLASS:  Class II     INDICATIONS:  Routine Risk Screening     MEDICATIONS:   Fentanyl 25 mcg IV, Versed 2 mg IV           DESCRIPTION OF PROCEDURE:   After the risks benefits and     alternatives of the procedure were thoroughly explained, informed     consent was obtained.  Digital rectal exam was performed and     revealed no abnormalities.   The LB160 [id number] endoscope was     introduced through the anus and advanced to the cecum, which was     identified by both the appendix and ileocecal valve, without     limitations.  The quality of the prep was excellent, using     MoviPrep.  The instrument was then slowly withdrawn as the colon     was fully examined.     <<PROCEDUREIMAGES>>           FINDINGS:  Mild diverticulosis was found in the sigmoid colon.     This was otherwise a normal examination of the colon.   Retroflexed     views in the rectum revealed no abnormalities.    The scope was     then withdrawn from the patient and the procedure completed.           COMPLICATIONS:  None     ENDOSCOPIC IMPRESSION:     1) Mild diverticulosis in the sigmoid colon     2) Otherwise normal examination, excellent prep           REPEAT EXAM:  In 10 year(s) for routine screening colonoscopy.           Iva Boop, MD, Clementeen Graham           CC:  The Patient           n.     eSIGNED:   Iva Boop at 02/21/2010 10:35 AM           Rocky Morel, [phone]  Note:  An exclamation mark (!) indicates a result that was not dispersed into the flowsheet. Document Creation Date: 02/21/2010 10:36 AM _______________________________________________________________________  (1) Order result status: Final Collection or observation date-time: 02/21/2010 10:21 Requested date-time:  Receipt date-time:  Reported date-time:  Referring Physician:   Ordering Physician: Stan Head [phone]) Specimen Source:  Source: Launa Grill Order Number: [id number] Lab site:   Appended Document: Colonoscopy    Clinical Lists Changes  Observations: Added new observation of COLONNXTDUE: 02/2020 (02/21/2010 14:01)      Appended Document: Colonoscopy     Procedures Next Due Date:    Colonoscopy: 02/2015  Appended  Document: Colonoscopy     Procedures Next Due Date:    Colonoscopy: 02/2020  Appended Document: endoscopy     Procedures Next Due Date:    EGD: 02/2015

## 2010-07-11 NOTE — Assessment & Plan Note (Signed)
Summary: shingles vac/cbs   Nurse Visit  CC: Shingles vacc./kb   Allergies: No Known Drug Allergies  Immunizations Administered:  Zostavax # 1:    Vaccine Type: Zostavax    Site: left deltoid    Mfr: Merck    Dose: 0.65 ml    Route: [place]    Given by: Lucious Groves CMA    Exp. Date: 04/14/2011    Lot #: [id number]    VIS given: 03/21/05 given June 04, 2010.  Orders Added: 1)  Zoster (Shingles) Vaccine Live [90736] 2)  Admin 1st Vaccine [phone]

## 2010-10-22 NOTE — Assessment & Plan Note (Signed)
HEALTHCARE                         GASTROENTEROLOGY OFFICE NOTE   [name], [name]                    MRN:          [id number]  DATE:11/25/2006                            DOB:          1945/09/12    CHIEF COMPLAINT:  Followup of Barrett's esophagus.  Question need  colonoscopy.   PROBLEMS:  1. Short-segment Barrett's esophagus, diagnosed by Dr. Sheryn Bison, March 16, 2004.  There is an irregular Z-line with      hiatal hernia.  He had a dilation for dysphagia.  Reflux symptoms      are well-controlled on Prevacid 30 mg daily.  2. History of screening colonoscopy, February 18, 2000.  This was      normal (Dr. Kinnie Scales).  Patient was instructed to get a routine      screening colonoscopy in ten years.  He recently received a letter      from Dr. Kinnie Scales, suggesting he come sooner.  3. History of nephrolithiasis.  4. Dyslipidemia.   ALLERGIES:  No known drug allergies.   MEDICATIONS:  1. Aspirin 81 mg daily.  2. Prevacid 30 mg daily.  3. Multivitamin daily.  4. Zocor 40 mg daily.   INTERVAL HISTORY:  The patient feels fine.  He is here to talk about  scheduling procedures.  He is a bit confused why he was given a letter  earlier than ten years for a routine colonoscopy.  There are no colon  symptoms and there is no bleeding.  He understands about his Barrett's  esophagus and the need for surveillance there.  There is no dysphagia.  There is no weight-loss.   He does tell me that he called Dr. Jennye Boroughs office and, when they found  out he had [employer], they told him they did not take that, so he  is discussing the potential colonoscopy with me.   PHYSICAL EXAM:  Height 5 feet 11, weight 214 pounds, pulse 72, blood  pressure is 136/90.   ASSESSMENT:  1. This man has Barrett's esophagus and is in need of a surveillance      endoscopy.  He had requested a switch to me from Dr. Jarold Motto.  2. He is at average risk for  colon cancer screening.  There are no      symptoms.  The standard recommendation is to do one every ten      years.  I explained the rationale behind this and how something      could be missed by waiting that long, though the odds are against      it, if not zero.  We have decided to proceed with home Hemoccults.      He did not want to proceed with a colonoscopy at this time.  If      they are positive, we will perform colonoscopy.  We will do them      annually until his ten-year anniversary at this point.   Please note, an additional medical problem is that he had an  appendectomy in 2005 also.     Iva Boop, MD,FACG  Electronically Signed    CEG/MedQ  DD: 11/26/2006  DT: 11/27/2006  Job #: [id number]   cc:   Willow Ora, MD

## 2010-10-25 NOTE — Discharge Summary (Signed)
NAMEELIYAHU, BILLE             ACCOUNT NO.:  [id number]   MEDICAL RECORD NO.:  [id number]          PATIENT TYPE:  INP   LOCATION:  0457                         FACILITY:  [facility]   PHYSICIAN:  Velora Heckler, MD      DATE OF BIRTH:  Sep 26, 1945   DATE OF ADMISSION:  04/09/2004  DATE OF DISCHARGE:  04/11/2004                                 DISCHARGE SUMMARY   REASON FOR ADMISSION:  Acute appendicitis.   HISTORY OF PRESENT ILLNESS:  Patient is a 65 year old white male who  presented to the emergency department with a 24-hour history of right lower  quadrant abdominal pain, fever, nausea and vomiting.  The patient was found  to have a white count of 11.7 with 87% segmented neutrophils.  CT scan of  the abdomen and pelvis demonstrated acute appendicitis.  Physical  examination was consistent with these findings.   HOSPITAL COURSE:  Patient was admitted on the general surgical service and  taken directly to the operating room, where he underwent laparoscopic  appendectomy.  Postoperatively, the patient did well.  He received 48 hours  of intravenous antibiotics.  He started with a clear-liquid diet and  advanced to a regular diet without difficulty.  He was prepared for  discharge home on the second postoperative day.   DISCHARGE PLAN:  Patient is discharged home today, April 11, 2004, in good  condition, tolerating a regular diet and ambulating independently.   DISCHARGE MEDICATIONS:  1.  Augmentin 875 mg p.o. b.i.d. x5 days.  2.  Darvocet as needed for pain.   Patient will be seen back in my office at [facility] in two  weeks.   FINAL DIAGNOSIS:  Acute appendicitis.   CONDITION ON DISCHARGE:  Improved.     Todd   TMG/MEDQ  D:  04/11/2004  T:  04/11/2004  Job:  [id number]   cc:   Wanda Plump, MD [facility]  [phone] [place], [place] [address]

## 2010-10-25 NOTE — Cardiovascular Report (Signed)
NAMEYUNIEL, [name]                         ACCOUNT NO.:  [id number]   MEDICAL RECORD NO.:  [id number]                   PATIENT TYPE:  OIB   LOCATION:  NA                                   FACILITY:  [facility]   PHYSICIAN:  Rollene Rotunda, M.D.                DATE OF BIRTH:  08-Jun-1946   DATE OF PROCEDURE:  01/20/2004  DATE OF DISCHARGE:                              CARDIAC CATHETERIZATION   PRIMARY CARE PHYSICIAN:  Wanda Plump, M.D.   CARDIOLOGIST:  Jesse Sans. Wall, M.D.   PROCEDURE:  Left heart catheterization and selective coronary arteriography.   CARDIOLOGIST PERFORMING CATHETERIZATION:  Rollene Rotunda, M.D.   INDICATIONS:  Evaluate patient with an abnormal Cardiolite suggesting  anterior infarct with peri-infarct ischemia.   DESCRIPTION OF PROCEDURE:  Left heart catheterization was performed via the  right femoral artery.  The artery was cannulated using an anterior wall  puncture.  A #6 French arterial sheath was inserted via the modified  Seldinger technique.  Preformed Judkins and a pigtail catheter were  utilized.  The  patient tolerated the procedure well and left the lab in stable condition.   RESULTS:  PRESSURES:  LV 130/17 AO 126/78.   CORONARY ARTERIOGRAPHY:  1. The left main coronary artery had luminakl irregularities.  2. The left anterior descending did not wrap the apex.  There was mid 25-30%     stenosis.  3. There was a large septal perforator which was normal.  There was a small     diagonal.  4. The circumflex and the AV groove were very small after the second obtuse     marginal.  The first obtuse marginal was a large branching vessel with     luminal irregularities.  The second obtuse marginal was moderate size     with long mid 25% stenosis.  5. The right coronary artery was a large dominant vessel.  There was mid     diffuse 25-30% stenosis.  There was distal 25% stenosis before the PDA.     The vessel terminated as a small PDA and posterior  laterals.   LEFT VENTRICULOGRAM:  The left ventriculogram was obtained in the RAO projection.  The EF was 60%  with normal wall motion.   CONCLUSION:  1. Mild coronary artery plaque.  2. Normal left ventricular function.   NO PALPABLE LYMPHADENOPATHY  No further cardiac workup is suggested.  The patient will have aggressive  risk reduction given the degree of plaque that he does have.                                               Rollene Rotunda, M.D.    JH/MEDQ  D:  01/19/2004  T:  01/20/2004  Job:  [id number]   cc:   Elita Quick  Elisabeth Cara, MD [facility]  [phone] [address] [place], [place] [address]

## 2010-10-25 NOTE — Op Note (Signed)
[name], [name]             ACCOUNT NO.:  [id number]   MEDICAL RECORD NO.:  [id number]          PATIENT TYPE:  INP   LOCATION:  0457                         [facility]ACILITY[facility]  [facility]   PHYSICIAN:  Velora Heckler, MD      DATE OF BIRTH:  August 05, 1945   DATE OF PROCEDURE:  04/09/2004  DATE OF DISCHARGE:                                 OPERATIVE REPORT   PREOPERATIVE DIAGNOSIS:  Acute appendicitis.   POSTOPERATIVE DIAGNOSIS:  Acute appendicitis.   OPERATION PERFORMED:  Laparoscopic appendectomy.   SURGEON:  Velora Heckler, M.D.   ANESTHESIA:  General.   ESTIMATED BLOOD LOSS:  Minimal.   PREPARATION:  Betadine.   COMPLICATIONS:  None.   INDICATIONS FOR PROCEDURE:  The patient is a 65 year old white male who  presented to the emergency department with right lower quadrant abdominal  pain on referral from his primary care physician, Aaron Plump, MD.  The  patient was found to have an elevated white blood cell count with a left  shift.  CT scan of the abdomen and pelvis confirmed acute appendicitis.  General surgery was consulted and the patient the patient was prepared for  the operating room.   DESCRIPTION OF PROCEDURE:  The procedure was done in operating room #1 at  the [facility].  The patient was brought to the  operating room and placed in supine position on the operating room table.  Following administration of general anesthesia, the patient was prepped and  draped in the usual strict aseptic fashion.  After ascertaining that an  adequate level of anesthesia had been obtained, a supraumbilical incision  was made in the midline with a #15 blade.  Dissection was carried down to  the fascia.  The fascia was incised in the midline and the peritoneal cavity  was entered cautiously.  A 0 Vicryl pursestring suture was placed in the  fascia.  A Hasson cannula was introduced and secured with a pursestring  suture.  The abdomen was insufflated with carbon dioxide.   The laparoscope  was introduced and the abdomen explored.  There was an inflammatory mass in  the right abdomen.  Operating ports were placed in the right upper quadrant  and left lower quadrant.  Using a Glassman clamp, the small bowel was  mobilized away from the inflammatory mass.  The cecum was mobilized.  The  appendix was adherent to the cecum laterally and to the abdominal side wall  and into the retroperitoneum.  Dissection at the base of the appendix  allowed for creation of a window.  An Endo GIA stapler was inserted, placed  across the base of the appendix and fired.  The mesoappendix was then taken  down with the Harmonic scalpel allowing the appendix to be dissected free  from underlying structures.  The mesoappendix was completely transected with  a Harmonic scalpel used for hemostasis.  The entire appendix was then freed  from its retroperitoneal adhesions and delivered into an EndoCatch bag and  withdrawn through the left lower quadrant port without difficulty.  The  abdomen was irrigated with warm  saline which was evacuated.  Good hemostasis  was noted in the operative field.  Fluid was retrieved.  The ports were  removed under direct vision.  Good hemostasis was noted at the port sites.  Pneumoperitoneum was released.  0 Vicryl pursestring suture was tied  securely.  The port sites were anesthetized with local anesthetic.  The  wounds were closed with interrupted 4-0 Vicryl subcuticular sutures.  Wounds  were washed and dried and benzoin and Steri-Strips were applied.  Sterile  dressings were applied.  The patient was awakened from anesthesia and  brought to the recovery room in stable condition.  The patient tolerated the  procedure well.     [name]  D:  04/09/2004  T:  04/10/2004  Job:  [id number]   cc:   Aaron Plump, MD [facility]  [phone] [place], [place] [address]   Jesse Sans. Wall, M.D.   Vania Rea. Jarold Motto, M.D. [facility]

## 2010-10-25 NOTE — Op Note (Signed)
NAMESIYON, LINCK             ACCOUNT NO.:  [id number]   MEDICAL RECORD NO.:  [id number]          PATIENT TYPE:  AMB   LOCATION:  [facility]                         [facility]ACILITY:  [facility]   PHYSICIAN:  Courtney Paris, M.D.DATE OF BIRTH:  [birth year]   DATE OF PROCEDURE:  04/04/2005  DATE OF DISCHARGE:                                 OPERATIVE REPORT   PREOPERATIVE DIAGNOSIS:  Right distal ureteral stone with nonpassage.   POSTOPERATIVE DIAGNOSES:  Right distal ureteral stone with nonpassage.   OPERATION:  Cysto, right retrograde pyelogram, right ureteroscopy with stone  extraction, right ureteral stent insertion.   ANESTHESIA:  General.   SURGEON:  Courtney Paris, M.D.   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 He is  not able to fly with this present. The stone has not passed. He has had  lithotripsy twice, the last was 2003 and one in 1987. He has had symptoms  for the last week since March 28, 2005 for this present stone which is now  down in the distal right ureter seen on preoperative KUB.   The patient was placed on the operating table in dorsal lithotomy position  and after satisfactory induction of general anesthesia was prepped and  draped with Betadine in the usual sterile fashion and given IV Cipro. He had  a short urethral stricture down in the deep bolus urethra just distal to the  external sphincter that was easily negotiated by the #21 scope. The bladder  was entered, the prostate was mildly enlarged in a trilobar fashion. The  bladder was entered, found to be 2+ trabeculated but had no bladder mucosal  lesions seen.   The right orifice was catheterized with a 6 open-ended ureteral catheter and  occlusive retrograde demonstrated a stone in the distal third of the ureter  about 8 cm in the UV junction. There was moderate hydronephrosis proximal to   this.   Under fluoroscopy, a guidewire was then passed beyond the stone up to level  of the kidney as the open-ended catheter was then removed. Over the  guidewire, a ureteral access sheath was then passed under fluoroscopy up to  the level of the stone just below the pelvic brim. This was done to dilate  the distal ureter. Leaving the guidewire in place, a short 6 ureteroscope  was then passed under direct vision using saline up to and just beyond the  stone. The stone was grasped with a basket and gently pulled out intact.  Another passage of the scope showed that there was a little bit of tearing  of the ureteral lumen but it seemed to be intact where the stone was  present. For this reason, I decided to go ahead and leave a ureteral stent.  A 6-French x 28 cm length double-J ureteral stent was passed over the  guidewire and under fluoroscopy left with a coil in the renal pelvis and one  in the bladder in a satisfactory manner. The bladder was then drained, scope  removed and he was given some IV Toradol and a B&O suppository.  He will be later discharged home on antibiotics, Pyridium and some more pain  pills as needed. I would like to leave the stent at least 5 days but no more  than 10. He can work out his work schedule to have this removed in the  office when he is able. Sent to the recovery room in good condition.      Courtney Paris, M.D.  Electronically Signed     HMK/MEDQ  D:  04/04/2005  T:  04/04/2005  Job:  [id number]

## 2010-10-25 NOTE — H&P (Signed)
[name], [name] NO.:  [id number]   MEDICAL RECORD NO.:  [id number]          PATIENT TYPE:  INP   LOCATION:  0105                         FACILITY:  [facility]   PHYSICIAN:  Aaron Heckler, MD      DATE OF BIRTH:  1946/04/28   DATE OF ADMISSION:  04/09/2004  DATE OF DISCHARGE:                                HISTORY & PHYSICAL   REFERRING PHYSICIAN:  Devoria Romero, M.D., emergency department.   PRIMARY CARE PHYSICIAN:  Aaron Plump, MD, [facility].   CARDIOLOGIST:  [name], M.D.   CHIEF COMPLAINT:  Abdominal pain.   HISTORY OF PRESENT ILLNESS:  Patient is a 65 year old white male referred to  the [facility] emergency department by Dr. Drue Romero for suspected acute  appendicitis.  Patient was in his normal state of good health until 7:30  p.m. on October 31.  He developed lower abdominal pain.  He was awakened  from sleep earlier this morning with progressive abdominal pain, fevers, and  chills.  Patient was seen by his primary physician.  Diagnosis of acute  appendicitis was suspected.  He was referred to [facility] emergency  department for assessment.  Laboratory studies showed an elevated white  blood cell count with a left shift.  CT scan of the abdomen and pelvis was  obtained, which confirmed acute appendicitis.  General surgery is now  consulted for management.   PAST MEDICAL HISTORY:  1.  History of gastroesophageal reflux disease and hiatal hernia diagnosed      last week on upper endoscopy by Dr. Sheryn Romero.  2.  History of lithotripsy for nephrolithiasis on two prior occasions.  3.  Recent history of chest discomfort requiring cardiac catheterization two      months ago by Dr. Valera Romero.  This was a negative study.  Patient also      had a cardiac echo.   MEDICATIONS:  Prevacid.   ALLERGIES:  None known.   SOCIAL HISTORY:  Patient is a Control and instrumentation engineer.  He is married.  He lives in  [place].  He has an 65 year old son.  He  quit smoking 30 years ago.  He  drinks alcohol on rare occasion.   FAMILY HISTORY:  Notable for coronary artery disease in the patient's  father.  No adverse events to anesthesia recorded in the family.   REVIEW OF SYSTEMS:  A 15-system review without significant other positives  except as noted above.   PHYSICAL EXAMINATION:  VITAL SIGNS:  Temp 102.1, pulse 96, respirations 20,  blood pressure 127/75.  GENERAL:  A 65 year old ill-appearing white male on a stretcher in the  emergency department.  HEENT:  Normocephalic and atraumatic.  Sclerae are anicteric.  Conjunctivae  are clear.  Pupils are equal and reactive.  Dentition is fair.  Mucous  membranes are moist.  Voice is normal.  NECK:  Supple without palpable mass or tenderness.  Thyroid is normal  without nodularity.  There is no lymphadenopathy.  There are no  supraclavicular masses.  LUNGS:  Clear to auscultation bilaterally without wheezes, rales or rhonchi.  HEART:  Regular rate and rhythm without murmur.  ABDOMEN:  Soft, mildly distended.  Hypoactive bowel sounds present.  There  is tenderness to palpation and percussion in the right lower quadrant.  There is rebound tenderness in the right lower quadrant.  There is voluntary  guarding in the right lower quadrant.  There are no surgical wounds.  There  are no signs of hernia.  EXTREMITIES:  Nontender without edema.  NEUROLOGIC:  Patient is alert and oriented without focal deficit.   LABORATORY STUDIES:  White count 11.7, hemoglobin 15.3, hematocrit 45.7%,  platelet count 240,000, neutrophils 87%, lymphocytes 6%.  Chemistry profile  is within normal limits.  Liver function tests are normal.   RADIOGRAPHIC STUDIES:  CT scan of the abdomen and pelvis with findings  consistent with acute appendicitis.   IMPRESSION:  Acute appendicitis.   PLAN:  I discussed at length with the patient and his wife the indications  for surgery.  I described a laparoscopic technique versus  open appendectomy.  I quoted them an approximately 80% chance of success by laparoscopic  technique.  We discussed the hospital stay to be expected.  We discussed  risks and benefits of the procedure.  We discussed the need for antibiotic  therapy.  Patient agrees and wishes to proceed as soon as possible.  We will  make arrangements with the operating room.     Wilhemena Durie  D:  04/09/2004  T:  04/09/2004  Job:  [id number]   cc:   Aaron Plump, MD [facility]  [phone] [place], [place] [address]   [name], M.D.   [name], M.D. [facility]

## 2010-10-25 NOTE — Procedures (Signed)
[facility]  Patient:    [name], [name]                      MRN: [id number] Proc. Date: 02/18/00 Adm. Date:  [id number] Attending:  Deneen Harts CCJamas Lav, M.D.   Procedure Report  PROCEDURE:  Colonoscopy.  INDICATION:  Colorectal neoplasia surveillance.  DESCRIPTION OF PROCEDURE:  After reviewing the nature of the procedure with the patient including potential risks and complications, and after discussing alternative methods of diagnosis and treatment, informed consent was signed.  The patient was premedicated receiving IV sedation totalling Versed 8 mg, fentanyl 100 mcg administered in divided doses prior to and during the course of the procedure.  Using an Olympus PCF-140L pediatric video colonoscope, rectum was intubated after normal digital examination.  The scope was advanced around the entire length of the colon without difficulty, reaching the cecum which was identified by the appendiceal orifice and ileocecal valve.  Preparation was good throughout.  The scope was slowly withdrawn with careful inspection of the entire colon in a retrograde manner.  There was no evidence of neoplasia.  No mucosal inflammation, erosion, ulceration.  No diverticular disease or vascular abnormality.  Retroflex view in the vault was normal.  The colon was decompressed and scope withdrawn.  The patient tolerated the procedure without difficulty being maintained on Datascope monitor and low-flow oxygen throughout.  Returned to recovery in stable condition.  Time 2, technical 1, preparation 2, total score = 5.  ASSESSMENT:  Normal colonoscopy.  RECOMMENDATIONS: 1. Annual Hemoccults per Dr. Suezanne Jacquet. 2. Colonoscopy - 10 years. DD:  02/18/00 TD:  02/19/00 Job: [id number]

## 2010-10-25 NOTE — Consult Note (Signed)
[facility]  Patient:    [name], [name] Visit Number: [id number] MRN: [id number]          Service Type: EMS Location: ED Attending Physician:  Benny Lennert Dictated by:   Verl Dicker, M.D. Admit Date:  10/24/2001 Discharge Date: 10/24/2001   CC:         Caryn Bee C. Sydnee Levans, M.D., [facility]   Consultation Report  DATE OF BIRTH:  1946/03/09  LOCAL MEDICAL PHYSICIAN:  Dr. Caryn Bee C. Claudette Laws [facility].  REASON FOR EMERGENCY ROOM VISIT:  Bilateral urolithiasis.  MEDICATIONS:  None.  RECENT PRESCRIPTION:  Tylenol No. 3.  ALLERGIES:  Denies.  HABITS:  Tobacco:  Denies.  ETOH:  Occasional.  PAST MEDICAL HISTORY:  ESWL in 1987.  SOCIAL HISTORY:  The patient works as a Hydrographic surveyor.  PHYSICAL EXAMINATION:  Please see Dr. Orland Jarred note from Oct 24, 2001.  HISTORY OF THE PRESENT ILLNESS:  Oct 21, 2001, patient working as a Pharmacist, hospital, noticed nausea, vomiting and diarrhea while in the [place], thought to be due to mild episode of food poisoning.  Oct 22, 2001, patient now in [place], noticed at 8:30 a.m., left CVA tenderness which reminded him of episode of urolithiasis in 1987, left CVA tenderness; no nausea, no vomiting, no fever.  By Oct 23, 2001, the patient was back in the [place] area with marked decrease in pain, saw Jamesetta Geralds, M.D. at the [facility].  KUB showed a nonobstructive stone in the lower pole of the right kidney, question of a small stone in the left distal ureter.  Patient presents to [facility] ER.  CT scan confirms those findings -- nonobstructive stone, lower pole, right kidney, small distal 2- to 3-mm calculus at the left ureter, somewhat difficult to examine the patient as all rooms were full, reluctant to do full genital and rectal exam with the patient in the hall.  CVA tenderness much improved.  The patient denied  nausea, vomiting or fever.  IMPRESSION:  Nonobstructive 5- to 7-mm calculus, lower pole, right kidney. Partially obstructive 2- to 3-mm calculus at the left distal ureter.  Patient has adequate supplies of Tylenol with codeine and Cipro.  He will see Dr. Verl Dicker back in the office in two to three days for repeat KUB.  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 Dictated by:   Verl Dicker, M.D. Attending Physician:  Benny Lennert DD:  10/24/01 TD:  10/26/01 Job: [id number]

## 2011-02-04 ENCOUNTER — Other Ambulatory Visit: Payer: Self-pay | Admitting: Internal Medicine

## 2011-02-05 NOTE — Telephone Encounter (Signed)
Rx Done . 

## 2011-02-16 ENCOUNTER — Other Ambulatory Visit: Payer: Self-pay | Admitting: Internal Medicine

## 2011-02-18 NOTE — Telephone Encounter (Signed)
Rx Done for 1-mth-Overdue for OV

## 2011-02-23 ENCOUNTER — Other Ambulatory Visit: Payer: Self-pay | Admitting: Internal Medicine

## 2011-03-07 ENCOUNTER — Encounter: Payer: Self-pay | Admitting: Internal Medicine

## 2011-03-07 ENCOUNTER — Ambulatory Visit (INDEPENDENT_AMBULATORY_CARE_PROVIDER_SITE_OTHER): Payer: Managed Care, Other (non HMO) | Admitting: Internal Medicine

## 2011-03-07 DIAGNOSIS — R7309 Other abnormal glucose: Secondary | ICD-10-CM

## 2011-03-07 DIAGNOSIS — R739 Hyperglycemia, unspecified: Secondary | ICD-10-CM

## 2011-03-07 DIAGNOSIS — Z0000 Encounter for general adult medical examination without abnormal findings: Secondary | ICD-10-CM

## 2011-03-07 LAB — COMPREHENSIVE METABOLIC PANEL
ALT: 35 U/L (ref 0–53)
AST: 25 U/L (ref 0–37)
CO2: 24 mEq/L (ref 19–32)
Calcium: 9.2 mg/dL (ref 8.4–10.5)
Chloride: 107 mEq/L (ref 96–112)
GFR: 98.71 mL/min (ref >60.00)
Potassium: 4.1 mEq/L (ref 3.5–5.1)
Sodium: 140 mEq/L (ref 135–145)
Total Protein: 7.4 g/dL (ref 6.0–8.3)

## 2011-03-07 LAB — CBC WITH DIFFERENTIAL/PLATELET
Basophils Absolute: 0 10*3/uL (ref 0.0–0.1)
Eosinophils Absolute: 0 10*3/uL (ref 0.0–0.7)
Hemoglobin: 16 g/dL (ref 13.0–17.0)
Lymphocytes Relative: 25.3 % (ref 12.0–46.0)
MCHC: 33.4 g/dL (ref 30.0–36.0)
Monocytes Relative: 10.2 % (ref 3.0–12.0)
Neutrophils Relative %: 62.8 % (ref 43.0–77.0)
Platelets: 227 10*3/uL (ref 150.0–400.0)
RDW: 13.8 % (ref 11.5–14.6)

## 2011-03-07 LAB — LIPID PANEL
LDL Cholesterol: 78 mg/dL (ref 0–99)
Total CHOL/HDL Ratio: 4
Triglycerides: 68 mg/dL (ref 0.0–149.0)

## 2011-03-07 LAB — TSH: TSH: 0.82 u[IU]/mL (ref 0.35–5.50)

## 2011-03-07 NOTE — Patient Instructions (Signed)
Check the  blood pressure 2 or 3 times a week, be sure it is less than 135/85. If it is consistently higher, let me know  

## 2011-03-07 NOTE — Progress Notes (Signed)
  Subjective:    Patient ID: [name], male    DOB: 08-27-1945, 65 y.o.   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 Hyperlipidemia   . Kidney stones   . Barrett's esophagus   . HYPERTENSION, MILD 02/04/2008   Past Surgical History  Procedure Date  . 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 No chest pain or palpitations. No dysuria or gross hematuria. No anxiety  depression. Ambulatory blood pressures 120/80.      Objective:   Physical Exam  Constitutional: He is oriented to person, place, and time. He appears well-developed and well-nourished.  HENT:  Head: Normocephalic and atraumatic.  Neck: No thyromegaly present.       Nl carotid   Cardiovascular: Normal rate, regular rhythm and normal heart sounds.   No murmur heard. Pulmonary/Chest: Effort normal and breath sounds normal. No respiratory distress. He has no wheezes. He has no rales.  Abdominal: Soft. Bowel sounds are normal. He exhibits no distension. There is no tenderness. There is no rebound and no guarding.  Genitourinary: Rectum normal and prostate normal.  Musculoskeletal: He exhibits no edema.  Neurological: He is alert and oriented to person, place, and time.  Psychiatric: He has a normal mood and affect. His behavior is normal. Judgment and thought content normal.      Assessment & Plan:

## 2011-03-07 NOTE — Assessment & Plan Note (Addendum)
Td 08 Shingle shot 05/2010 pneumonia shot 06-- due for one  had a Cscope 2001 and 02-2010, and next in 10 years Again we discussed the need to practice a healthy diet and get more physically active has EKGs done  routinely at the time of his flight physicals Followup in one year as long as he feels well and his BP is stable

## 2011-03-10 ENCOUNTER — Telehealth: Payer: Self-pay | Admitting: Internal Medicine

## 2011-03-10 ENCOUNTER — Telehealth: Payer: Self-pay | Admitting: *Deleted

## 2011-03-10 MED ORDER — SIMVASTATIN 40 MG PO TABS: 20.0000 mg | ORAL_TABLET | Freq: Every day | ORAL | Status: DC

## 2011-03-10 MED ORDER — TELMISARTAN 40 MG PO TABS: 40.0000 mg | ORAL_TABLET | Freq: Every day | ORAL | Status: DC

## 2011-03-10 MED ORDER — LANSOPRAZOLE 30 MG PO CPDR: 30.0000 mg | DELAYED_RELEASE_CAPSULE | Freq: Every day | ORAL | Status: DC

## 2011-03-10 MED ORDER — ZOLPIDEM TARTRATE 10 MG PO TABS: 10.0000 mg | ORAL_TABLET | Freq: Every evening | ORAL | Status: DC | PRN

## 2011-03-10 NOTE — Telephone Encounter (Signed)
Pt was in for OV Friday, 03/07/2011. Calling this AM for medication refills to be sent to [facility] in [place]. Zocor was sent in error by MD to [facility] in [place]/Pt has moved; deleted from demographics. Rx[s] sent per Pt request.

## 2011-03-10 NOTE — Telephone Encounter (Signed)
Advise patient: 1. All his blood work is fine, cholesterol well controlled. Continue with same medications. 2. I forgot to provide him a pneumonia shot, please apologize from my part. Recommend to come by the office at his convenience just for the pneumonia shot. Call my nurse ahead. thank you

## 2011-03-11 MED ORDER — SIMVASTATIN 40 MG PO TABS: ORAL_TABLET | ORAL | Status: DC

## 2011-03-11 MED ORDER — TELMISARTAN 40 MG PO TABS: 20.0000 mg | ORAL_TABLET | Freq: Every day | ORAL | Status: DC

## 2011-03-11 NOTE — Telephone Encounter (Signed)
Patient informed; will be in tomorrow morning for Pneumonia after 8:00am  Medication discrepancy on Micardis & Simvastatin [printout given to MD for clarification] . Micardis SIG: 40mg  1 tablet daily [Pt states he takes 0.5 tablet daily] Simvastatin SIG: 40mg  0.5 tablet daily [Pt states he takes 1.5 tablets daily]

## 2011-03-12 ENCOUNTER — Ambulatory Visit (INDEPENDENT_AMBULATORY_CARE_PROVIDER_SITE_OTHER): Payer: Managed Care, Other (non HMO) | Admitting: *Deleted

## 2011-03-12 DIAGNOSIS — Z23 Encounter for immunization: Secondary | ICD-10-CM

## 2011-07-12 ENCOUNTER — Other Ambulatory Visit: Payer: Self-pay | Admitting: Internal Medicine

## 2011-07-14 NOTE — Telephone Encounter (Signed)
Refill done.  

## 2011-08-11 ENCOUNTER — Other Ambulatory Visit: Payer: Self-pay | Admitting: Internal Medicine

## 2011-08-11 NOTE — Telephone Encounter (Signed)
Refill done.  

## 2011-09-21 ENCOUNTER — Other Ambulatory Visit: Payer: Self-pay | Admitting: Internal Medicine

## 2011-09-22 NOTE — Telephone Encounter (Signed)
Refill done.  

## 2011-10-03 ENCOUNTER — Telehealth: Payer: Self-pay | Admitting: Internal Medicine

## 2011-10-03 NOTE — Telephone Encounter (Signed)
Spoke with the pharmacy & confirmed they still have 2 refills remaining for the pt to pick up.

## 2011-10-03 NOTE — Telephone Encounter (Signed)
refill Lansoprazole DR 30MG  Capsule Qty requested 90  Last written 3.4.13, as TAKE 1 CAPSULE BY MOUTH DAILY Qty 30 Last OV 9.28.2012

## 2011-10-06 NOTE — Telephone Encounter (Signed)
Yes please

## 2011-10-06 NOTE — Telephone Encounter (Signed)
Done, faxed all noted to pharmacy

## 2011-10-06 NOTE — Telephone Encounter (Signed)
Received a 2nd request for this today, would you like for me to print out the note below and fax to the pharmacy?

## 2011-11-30 ENCOUNTER — Other Ambulatory Visit: Payer: Self-pay | Admitting: Internal Medicine

## 2011-12-01 NOTE — Telephone Encounter (Signed)
Refill done.  

## 2012-03-11 ENCOUNTER — Other Ambulatory Visit: Payer: Self-pay | Admitting: Internal Medicine

## 2012-03-11 NOTE — Telephone Encounter (Signed)
Refill done.  

## 2012-03-24 ENCOUNTER — Ambulatory Visit (INDEPENDENT_AMBULATORY_CARE_PROVIDER_SITE_OTHER): Payer: BC Managed Care – PPO | Admitting: Internal Medicine

## 2012-03-24 VITALS — BP 144/86 | HR 75 | Temp 98.0°F | Ht 71.0 in | Wt 231.0 lb

## 2012-03-24 DIAGNOSIS — K2270 Barrett's esophagus without dysplasia: Secondary | ICD-10-CM

## 2012-03-24 DIAGNOSIS — Z0000 Encounter for general adult medical examination without abnormal findings: Secondary | ICD-10-CM

## 2012-03-24 DIAGNOSIS — Z23 Encounter for immunization: Secondary | ICD-10-CM

## 2012-03-24 DIAGNOSIS — G4700 Insomnia, unspecified: Secondary | ICD-10-CM

## 2012-03-24 DIAGNOSIS — I10 Essential (primary) hypertension: Secondary | ICD-10-CM

## 2012-03-24 DIAGNOSIS — R7309 Other abnormal glucose: Secondary | ICD-10-CM

## 2012-03-24 DIAGNOSIS — R739 Hyperglycemia, unspecified: Secondary | ICD-10-CM

## 2012-03-24 LAB — LIPID PANEL
Cholesterol: 132 mg/dL (ref 0–200)
HDL: 34.7 mg/dL — ABNORMAL LOW (ref >39.00)
VLDL: 18.4 mg/dL (ref 0.0–40.0)

## 2012-03-24 LAB — CBC WITH DIFFERENTIAL/PLATELET
Basophils Absolute: 0 10*3/uL (ref 0.0–0.1)
Eosinophils Absolute: 0.1 10*3/uL (ref 0.0–0.7)
Lymphocytes Relative: 20.3 % (ref 12.0–46.0)
MCHC: 32.7 g/dL (ref 30.0–36.0)
Monocytes Relative: 11.1 % (ref 3.0–12.0)
Neutro Abs: 3.8 10*3/uL (ref 1.4–7.7)
Neutrophils Relative %: 66.7 % (ref 43.0–77.0)
Platelets: 228 10*3/uL (ref 150.0–400.0)
RDW: 13.6 % (ref 11.5–14.6)

## 2012-03-24 LAB — COMPREHENSIVE METABOLIC PANEL
ALT: 32 U/L (ref 0–53)
AST: 25 U/L (ref 0–37)
Albumin: 4.2 g/dL (ref 3.5–5.2)
CO2: 22 mEq/L (ref 19–32)
Calcium: 9.2 mg/dL (ref 8.4–10.5)
Chloride: 108 mEq/L (ref 96–112)
Creatinine, Ser: 0.8 mg/dL (ref 0.4–1.5)
GFR: 105.71 mL/min (ref >60.00)
Potassium: 4.2 mEq/L (ref 3.5–5.1)
Sodium: 139 mEq/L (ref 135–145)
Total Protein: 7.1 g/dL (ref 6.0–8.3)

## 2012-03-24 LAB — TSH: TSH: 0.72 u[IU]/mL (ref 0.35–5.50)

## 2012-03-24 LAB — PSA: PSA: 0.85 ng/mL (ref 0.10–4.00)

## 2012-03-24 MED ORDER — SIMVASTATIN 40 MG PO TABS: ORAL_TABLET | ORAL | Status: DC

## 2012-03-24 MED ORDER — LANSOPRAZOLE 30 MG PO CPDR: 30.0000 mg | DELAYED_RELEASE_CAPSULE | Freq: Every day | ORAL | Status: DC

## 2012-03-24 MED ORDER — TELMISARTAN 40 MG PO TABS: 20.0000 mg | ORAL_TABLET | Freq: Every day | ORAL | Status: DC

## 2012-03-24 NOTE — Assessment & Plan Note (Signed)
Patient reports he is here for EGD next year. Currently asymptomatic. Good compliance with PPIs

## 2012-03-24 NOTE — Assessment & Plan Note (Signed)
BP slightly elevated today, see instructions, no change for now

## 2012-03-24 NOTE — Assessment & Plan Note (Addendum)
Not needing Remus Loffler anymore as he is retiring

## 2012-03-24 NOTE — Progress Notes (Signed)
  Subjective:    Patient ID: LOXLEY SCHMALE, male    DOB: 08-Sep-1945, 66 y.o.   MRN: [id number]  HPI  CPX  Past Medical History  Diagnosis Date  . Hyperlipidemia   . Kidney stones   . Barrett's esophagus   . HYPERTENSION, MILD 02/04/2008   Past Surgical History  Procedure Date  . 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 No anxiety depression. No ambulatory BPs    Objective:   Physical Exam General -- alert, well-developed, and BMI 32.   Neck --no thyromegaly , normal carotid pulse Lungs -- normal respiratory effort, no intercostal retractions, no accessory muscle use, and normal breath sounds.   Heart-- normal rate, regular rhythm, no murmur, and no gallop.   Abdomen--soft, non-tender, no distention, no masses, no HSM, no guarding, and no rigidity.   Extremities-- no pretibial edema bilaterally Rectal-- No external abnormalities noted. Normal sphincter tone. No rectal masses or tenderness. Brown stool  Prostate:  Prostate gland firm and smooth, no enlargement,   tenderness,  ?minute  nodule at the R (1-2 mm) Neurologic-- alert & oriented X3 and strength normal in all extremities. Psych-- Cognition and judgment appear intact. Alert and cooperative with normal attention span and concentration.  not anxious appearing and not depressed appearing.       Assessment & Plan:

## 2012-03-24 NOTE — Patient Instructions (Addendum)
Check the  blood pressure 2 or 3 times a week, be sure it is between 110/60 and 135/85. If it is consistently higher or lower, let me know  

## 2012-03-24 NOTE — Assessment & Plan Note (Signed)
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 If PSA elevated will send to urology, if not , recheck in 1 year Chronic medical issues-- labs, RTC  1 year

## 2012-03-25 ENCOUNTER — Encounter: Payer: Self-pay | Admitting: Internal Medicine

## 2012-03-29 ENCOUNTER — Encounter: Payer: Self-pay | Admitting: *Deleted

## 2012-03-30 ENCOUNTER — Other Ambulatory Visit: Payer: Self-pay | Admitting: Internal Medicine

## 2012-03-30 NOTE — Telephone Encounter (Signed)
Cindy at pharmacy said that pt still has refills left on file. Refill request sent in error.

## 2012-09-27 ENCOUNTER — Encounter: Payer: Self-pay | Admitting: *Deleted

## 2012-09-27 ENCOUNTER — Telehealth: Payer: Self-pay | Admitting: *Deleted

## 2012-09-27 NOTE — Telephone Encounter (Signed)
Pt states that BP for the last 2 day has been elevated .BP Today 158/104 and yesterday 166/114. Pt denies any symptoms right now and states that he feels fine. Pt also states that he dropped off letter stating that a temporary supply has been given and that will have to authorize any additional refills.

## 2012-09-27 NOTE — Telephone Encounter (Signed)
He is taking Micardis 40 mg half tablet daily, recommend to increase to one tablet daily. Continue checking his BP, call with readings in 2 weeks. Okay to call medication for 3 months and one refill

## 2012-09-28 NOTE — Telephone Encounter (Signed)
lmovm for pt to return call.  

## 2012-09-29 MED ORDER — SIMVASTATIN 40 MG PO TABS: ORAL_TABLET | ORAL | Status: DC

## 2012-09-29 NOTE — Telephone Encounter (Signed)
Discussed with pt

## 2012-10-01 ENCOUNTER — Telehealth: Payer: Self-pay | Admitting: Internal Medicine

## 2012-10-01 NOTE — Telephone Encounter (Signed)
I received a letter from the patient's insurance regards simvastatin. Please clarify

## 2012-10-04 NOTE — Telephone Encounter (Signed)
The insurance will not cover a 90-day supply. Resent rx to pharmacy for 1 month supply.

## 2012-10-12 ENCOUNTER — Other Ambulatory Visit: Payer: Self-pay | Admitting: General Practice

## 2012-10-12 ENCOUNTER — Other Ambulatory Visit: Payer: Self-pay | Admitting: Internal Medicine

## 2012-10-12 DIAGNOSIS — E785 Hyperlipidemia, unspecified: Secondary | ICD-10-CM

## 2012-10-12 MED ORDER — SIMVASTATIN 40 MG PO TABS: ORAL_TABLET | ORAL | Status: DC

## 2012-10-12 NOTE — Telephone Encounter (Addendum)
Change to lipitor 40 mg 1 po qd #90, 1 Rf Arrange FLP AST ALT in 2 months

## 2012-10-12 NOTE — Telephone Encounter (Signed)
Orders placed, medication changed, chart updated. Called Pt and LM for him to call office to schedule Fasting Labs.

## 2012-10-12 NOTE — Telephone Encounter (Signed)
Pt insurance company contacted office stated that they cover a 90 day supply only, and will only allow 1 tablet daily of any strength without a PA. Would we like to begin PA on Simvastatin 40mg  (Sig: take 1 and 1/2 tablet daily) or change medication?

## 2012-10-12 NOTE — Telephone Encounter (Signed)
Pt scheduled for labs

## 2012-10-18 ENCOUNTER — Other Ambulatory Visit: Payer: Self-pay | Admitting: *Deleted

## 2012-10-18 MED ORDER — TELMISARTAN 40 MG PO TABS: 40.0000 mg | ORAL_TABLET | Freq: Every day | ORAL | Status: DC

## 2012-10-18 NOTE — Telephone Encounter (Signed)
Rx sent 

## 2012-11-04 ENCOUNTER — Telehealth: Payer: Self-pay | Admitting: Internal Medicine

## 2012-11-04 NOTE — Telephone Encounter (Signed)
Patient Information:  Caller Name: Phillippe  Phone: [phone]  Patient: [name], [name]  Gender: Male  DOB: [birth year]  Age: 67 Years  PCP: Willow Ora  Office Follow Up:  Does the office need to follow up with this patient?: No  Instructions For The Office:  Transferred to appointment line to see PCP within 2 weeks.     Symptoms  Reason For Call & Symptoms: Patient reports he has had high blood pressure readings; increased Micardis as ordered.  Daily headaches start around noon rated at 3-4 of 10, relieved by Advil.   BP's 180/115 late April to early May;  more recently runs near 159/104; daily headaches. BP  137/87 currently after shower.  Reviewed Health History In EMR: Yes  Reviewed Medications In EMR: Yes  Reviewed Allergies In EMR: Yes  Reviewed Surgeries / Procedures: Yes  Date of Onset of Symptoms: 10/25/2012  Treatments Tried: Advil  Treatments Tried Worked: Yes  Guideline(s) Used:  High Blood Pressure  Headache  Disposition Per Guideline:   See Within 2 Weeks in Office  Reason For Disposition Reached:   BP > 140/90 and is taking BP medications  Advice Given:  General:  Untreated high blood pressure may cause damage to the heart, brain, kidneys, and eyes.  Treatment of high blood pressure can reduce the risk of stroke, heart attack, and heart failure.  The goal of blood pressure treatment for most patients with hypertension is to keep the blood pressure under 140/90.  Lifestyle Changes  Maintain a healthy weight. Lose weight if you are overweight.  Do 30 minutes of aerobic physical activity (e.g., brisk walking) most days of the week.  Eat a diet high in fresh fruits and low-fat dairy products. Limit your intake of saturated and total fat. Choose foods that are lower in salt.  If you smoke, you should stop.  If you drink alcohol, you should limit your daily alcohol drinking. Women should have no more than one drink per day. Men should have no more than 2 drinks  per day. A drink is defined as 1.5 oz hard liquor (one shot or jigger; 45 ml), 5 oz wine (small glass; 150 ml), or 12 oz beer (one can; 360 ml).  Call Back If:  Headache, blurred vision, difficulty talking, or difficulty walking occurs  Chest pain or difficulty breathing occurs  You want to go in to the office for a blood pressure check  You become worse.  Pain Medicines:  For pain relief, you can take either acetaminophen, ibuprofen, or naproxen.  They are over-the-counter (OTC) pain drugs. You can buy them at the drugstore.  Rest:   Lie down in a dark, quiet place and try to relax. Close your eyes and imagine your entire body relaxing.  Call Back If:  Headache lasts longer than 24 hours  You become worse.  Patient Will Follow Care Advice:  YES

## 2012-11-04 NOTE — Telephone Encounter (Signed)
Pt has an appt 6.2.14.

## 2012-11-08 ENCOUNTER — Encounter: Payer: Self-pay | Admitting: Internal Medicine

## 2012-11-08 ENCOUNTER — Ambulatory Visit (INDEPENDENT_AMBULATORY_CARE_PROVIDER_SITE_OTHER): Payer: BC Managed Care – PPO | Admitting: Internal Medicine

## 2012-11-08 VITALS — BP 142/90 | HR 76 | Temp 98.2°F | Wt 221.0 lb

## 2012-11-08 DIAGNOSIS — I10 Essential (primary) hypertension: Secondary | ICD-10-CM

## 2012-11-08 DIAGNOSIS — E785 Hyperlipidemia, unspecified: Secondary | ICD-10-CM

## 2012-11-08 MED ORDER — AMLODIPINE BESYLATE 5 MG PO TABS: 5.0000 mg | ORAL_TABLET | Freq: Every day | ORAL | Status: DC

## 2012-11-08 MED ORDER — ATORVASTATIN CALCIUM 40 MG PO TABS: 40.0000 mg | ORAL_TABLET | Freq: Every day | ORAL | Status: DC

## 2012-11-08 NOTE — Patient Instructions (Addendum)
Add amlodipine. Stop simvastatin, start atorvastatin 40 mg 1 tablet daily. ---- Come back fasting in 6 weeks from now for labs: BMP--- dx  hypertension FLP, AST, ALT --- dx high cholesterol ---- Sodium-Controlled Diet Sodium is a mineral. It is found in many foods. Sodium may be found naturally or added during the making of a food. The most common form of sodium is salt, which is made up of sodium and chloride. Reducing your sodium intake involves changing your eating habits. The following guidelines will help you reduce the sodium in your diet:  Stop using the salt shaker.  Use salt sparingly in cooking and baking.  Substitute with sodium-free seasonings and spices.  Do not use a salt substitute (potassium chloride) without your caregiver's permission.  Include a variety of fresh, unprocessed foods in your diet.  Limit the use of processed and convenience foods that are high in sodium. USE THE FOLLOWING FOODS SPARINGLY: Breads/Starches  Commercial bread stuffing, commercial pancake or waffle mixes, coating mixes. Waffles. Croutons. Prepared (boxed or frozen) potato, rice, or noodle mixes that contain salt or sodium. Salted [place] fries or hash browns. Salted popcorn, breads, crackers, chips, or snack foods. Vegetables  Vegetables canned with salt or prepared in cream, butter, or cheese sauces. Sauerkraut. Tomato or vegetable juices canned with salt.  Fresh vegetables are allowed if rinsed thoroughly. Fruit  Fruit is okay to eat. Meat and Meat Substitutes  Salted or smoked meats, such as bacon or Canadian bacon, chipped or corned beef, hot dogs, salt pork, luncheon meats, pastrami, ham, or sausage. Canned or smoked fish, poultry, or meat. Processed cheese or cheese spreads, blue or [place] cheese. Battered or frozen fish products. Prepared spaghetti sauce. Baked beans. Reuben sandwiches. Salted nuts. Caviar. Milk  Limit buttermilk to 1 cup per week. Soups and Combination  Foods  Bouillon cubes, canned or dried soups, broth, consomm. Convenience (frozen or packaged) dinners with more than 600 mg sodium. Pot pies, pizza, Asian food, fast food cheeseburgers, and specialty sandwiches. Desserts and Sweets  Regular (salted) desserts, pie, commercial fruit snack pies, commercial snack cakes, canned puddings.  Eat desserts and sweets in moderation. Fats and Oils  Gravy mixes or canned gravy. No more than 1 to 2 tbs of salad dressing. Chip dips.  Eat fats and oils in moderation. Beverages  See those listed under the vegetables and milk groups. Condiments  Ketchup, mustard, meat sauces, salsa, regular (salted) and lite soy sauce or mustard. Dill pickles, olives, meat tenderizer. Prepared horseradish or pickle relish. Dutch-processed cocoa. Baking powder or baking soda used medicinally. Worcestershire sauce. "Light" salt. Salt substitute, unless approved by your caregiver. Document Released: 11/15/2001 Document Revised: 08/18/2011 Document Reviewed: 06/18/2009 [facility] Patient Information 2014 [place], [place].

## 2012-11-08 NOTE — Progress Notes (Signed)
  Subjective:    Patient ID: [name], male    DOB: 08-11-1945, 67 y.o.   MRN: [id number]  HPI Acute visit. A month ago the patient started to check his BP regularly, it was as high as 170/100, we instructed him to increase micardis from half to one tablet qd. Even with the increase, his blood pressure is in the  150s/90s. He did have some sinus headaches 2 weeks ago, he thought it was a micardis  issue, he changed to timing and is now taking it in the morning and he feels better.  Past Medical History  Diagnosis Date  . Hyperlipidemia   . Kidney stones   . Barrett's esophagus   . HYPERTENSION, MILD 02/04/2008   Past Surgical History  Procedure Laterality Date  . 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 Diet is good most of the time. No chest pain or shortness or breath. No palpitations or LE edema.     Objective:   Physical Exam General -- alert, well-developed, NAD   Lungs -- normal respiratory effort, no intercostal retractions, no accessory muscle use, and normal breath sounds.   Heart-- normal rate, regular rhythm, no murmur, and no gallop.   extremities-- no pretibial edema bilaterally  Neurologic-- alert & oriented X3 and strength normal in all extremities. Psych-- Cognition and judgment appear intact. Alert and cooperative with normal attention span and concentration.  not anxious appearing and not depressed appearing.       Assessment & Plan:

## 2012-11-08 NOTE — Assessment & Plan Note (Signed)
Cholesterol well-controlled with simvastatin 40 mg 1.5 tablets daily ---->  due to insurance constraints, he has been taking one tablet daily only. Today we are adding amlodipine to his regimen consequently we discontinue simvastatin to prevent interactions and start Lipitor 40 mg daily. FLP in 4-6 weeks

## 2012-11-08 NOTE — Assessment & Plan Note (Addendum)
In the last month BP has been as high as 170/100, we increase Micardis from 20-40 mg. His blood pressure is still in the 150/90s with his BP cuff (BP cuff measured against our and is reading a little high) Plan: Add amlodipine 5 mg. Continue with exercise Low-salt diet discussed BMP in 4-6 weeks

## 2012-12-13 ENCOUNTER — Other Ambulatory Visit (INDEPENDENT_AMBULATORY_CARE_PROVIDER_SITE_OTHER): Payer: Medicare Other

## 2012-12-13 DIAGNOSIS — E785 Hyperlipidemia, unspecified: Secondary | ICD-10-CM

## 2012-12-13 LAB — LIPID PANEL
Cholesterol: 134 mg/dL (ref 0–200)
HDL: 38.8 mg/dL — ABNORMAL LOW (ref >39.00)
LDL Cholesterol: 72 mg/dL (ref 0–99)
Total CHOL/HDL Ratio: 3
Triglycerides: 116 mg/dL (ref 0.0–149.0)

## 2012-12-14 ENCOUNTER — Encounter: Payer: Self-pay | Admitting: *Deleted

## 2013-02-28 ENCOUNTER — Other Ambulatory Visit: Payer: Self-pay | Admitting: Internal Medicine

## 2013-02-28 NOTE — Telephone Encounter (Signed)
rx refilled per protocol. DJR  

## 2013-03-09 ENCOUNTER — Telehealth: Payer: Self-pay

## 2013-03-09 NOTE — Telephone Encounter (Signed)
HM UTD WE flu vaccine No colonoscopy on file Due for repeat endoscopy with GI this year Meds reconciled, pharmacy and allergies verified

## 2013-03-10 ENCOUNTER — Encounter: Payer: Self-pay | Admitting: Internal Medicine

## 2013-03-10 ENCOUNTER — Other Ambulatory Visit: Payer: Self-pay | Admitting: Internal Medicine

## 2013-03-10 ENCOUNTER — Ambulatory Visit (INDEPENDENT_AMBULATORY_CARE_PROVIDER_SITE_OTHER): Payer: Medicare Other | Admitting: Internal Medicine

## 2013-03-10 VITALS — BP 129/86 | HR 67 | Temp 98.4°F | Ht 71.9 in | Wt 221.0 lb

## 2013-03-10 DIAGNOSIS — I10 Essential (primary) hypertension: Secondary | ICD-10-CM

## 2013-03-10 DIAGNOSIS — N402 Nodular prostate without lower urinary tract symptoms: Secondary | ICD-10-CM

## 2013-03-10 DIAGNOSIS — Z23 Encounter for immunization: Secondary | ICD-10-CM

## 2013-03-10 DIAGNOSIS — K2270 Barrett's esophagus without dysplasia: Secondary | ICD-10-CM

## 2013-03-10 DIAGNOSIS — Z0000 Encounter for general adult medical examination without abnormal findings: Secondary | ICD-10-CM

## 2013-03-10 DIAGNOSIS — E785 Hyperlipidemia, unspecified: Secondary | ICD-10-CM

## 2013-03-10 LAB — CBC WITH DIFFERENTIAL/PLATELET
Basophils Relative: 0.4 % (ref 0.0–3.0)
HCT: 45.8 % (ref 39.0–52.0)
Hemoglobin: 15.8 g/dL (ref 13.0–17.0)
Lymphocytes Relative: 23.6 % (ref 12.0–46.0)
MCHC: 34.4 g/dL (ref 30.0–36.0)
Monocytes Relative: 8.7 % (ref 3.0–12.0)
Neutro Abs: 3.8 10*3/uL (ref 1.4–7.7)
RBC: 5.07 Mil/uL (ref 4.22–5.81)
WBC: 5.7 10*3/uL (ref 4.5–10.5)

## 2013-03-10 NOTE — Assessment & Plan Note (Signed)
Well controlled with current meds, check LFTs

## 2013-03-10 NOTE — Assessment & Plan Note (Addendum)
Asymptomatic, on PPIs, I reviewed the GI letter, next EGD 2016

## 2013-03-10 NOTE — Assessment & Plan Note (Signed)
BP well-controlled, no apparent side effects from medication, check a BMP and a CBC

## 2013-03-10 NOTE — Patient Instructions (Signed)
Get your blood work before you leave  Next visit in 6 monbths   for a check up  Recommend the  high dose flu shot, you could  get into your pharmacy or here in 2 or 3 weeks  Fall Prevention and Home Safety Falls cause injuries and can affect all age groups. It is possible to use preventive measures to significantly decrease the likelihood of falls. There are many simple measures which can make your home safer and prevent falls. OUTDOORS  Repair cracks and edges of walkways and driveways.  Remove high doorway thresholds.  Trim shrubbery on the main path into your home.  Have good outside lighting.  Clear walkways of tools, rocks, debris, and clutter.  Check that handrails are not broken and are securely fastened. Both sides of steps should have handrails.  Have leaves, snow, and ice cleared regularly.  Use sand or salt on walkways during winter months.  In the garage, clean up grease or oil spills. BATHROOM  Install night lights.  Install grab bars by the toilet and in the tub and shower.  Use non-skid mats or decals in the tub or shower.  Place a plastic non-slip stool in the shower to sit on, if needed.  Keep floors dry and clean up all water on the floor immediately.  Remove soap buildup in the tub or shower on a regular basis.  Secure bath mats with non-slip, double-sided rug tape.  Remove throw rugs and tripping hazards from the floors. BEDROOMS  Install night lights.  Make sure a bedside light is easy to reach.  Do not use oversized bedding.  Keep a telephone by your bedside.  Have a firm chair with side arms to use for getting dressed.  Remove throw rugs and tripping hazards from the floor. KITCHEN  Keep handles on pots and pans turned toward the center of the stove. Use back burners when possible.  Clean up spills quickly and allow time for drying.  Avoid walking on wet floors.  Avoid hot utensils and knives.  Position shelves so they are not  too high or low.  Place commonly used objects within easy reach.  If necessary, use a sturdy step stool with a grab bar when reaching.  Keep electrical cables out of the way.  Do not use floor polish or wax that makes floors slippery. If you must use wax, use non-skid floor wax.  Remove throw rugs and tripping hazards from the floor. STAIRWAYS  Never leave objects on stairs.  Place handrails on both sides of stairways and use them. Fix any loose handrails. Make sure handrails on both sides of the stairways are as long as the stairs.  Check carpeting to make sure it is firmly attached along stairs. Make repairs to worn or loose carpet promptly.  Avoid placing throw rugs at the top or bottom of stairways, or properly secure the rug with carpet tape to prevent slippage. Get rid of throw rugs, if possible.  Have an electrician put in a light switch at the top and bottom of the stairs. OTHER FALL PREVENTION TIPS  Wear low-heel or rubber-soled shoes that are supportive and fit well. Wear closed toe shoes.  When using a stepladder, make sure it is fully opened and both spreaders are firmly locked. Do not climb a closed stepladder.  Add color or contrast paint or tape to grab bars and handrails in your home. Place contrasting color strips on first and last steps.  Learn and use mobility  aids as needed. Install an electrical emergency response system.  Turn on lights to avoid dark areas. Replace light bulbs that burn out immediately. Get light switches that glow.  Arrange furniture to create clear pathways. Keep furniture in the same place.  Firmly attach carpet with non-skid or double-sided tape.  Eliminate uneven floor surfaces.  Select a carpet pattern that does not visually hide the edge of steps.  Be aware of all pets. OTHER HOME SAFETY TIPS  Set the water temperature for 120 F (48.8 C).  Keep emergency numbers on or near the telephone.  Keep smoke detectors on every  level of the home and near sleeping areas. Document Released: 05/16/2002 Document Revised: 11/25/2011 Document Reviewed: 08/15/2011 [facility] Patient Information 2014 [place], [place].

## 2013-03-10 NOTE — Assessment & Plan Note (Addendum)
Td 08 Shingle shot 05/2010 Flu shot -- needs high dose  pneumonia shot 06, 2012  had a Cscope 2001 and 02-2010, and next in 10 years life style discussed  Minute nodule on DRE, PSA wnl-- refer to urology

## 2013-03-10 NOTE — Progress Notes (Signed)
  Subjective:    Patient ID: [name], male    DOB: 05-03-1946, 67 y.o.   MRN: [id number]  HPI Here for Medicare AWV: 1. Risk factors based on Past M, S, F history: reviewed 2. Physical Activities: 3 mile walk x 5/week, golf 3. Depression/mood: neg screening  4. Hearing: No problemss noted or reported  5. ADL's:  Independent  6. Fall Risk: no recent fall, see instructions  7. home Safety: does feel safe at home  8. Height, weight, & visual acuity: see VS, No problemss noted or reported  9. Counseling: provided 10. Labs ordered based on risk factors: if needed  11. Referral Coordination: if needed 12. Care Plan, see assessment and plan  13. Cognitive Assessment: motor skills and cognition intact   In addition, today we discussed the following:  Hypertension, good medication compliance, ambulatory BPs usually 118/65 GERD, history of Barrett's, on PPIs, asymptomatic. High cholesterol, good medication compliance.  Past Medical History  Diagnosis Date  . Hyperlipidemia   . Kidney stones   . Barrett's esophagus   . HYPERTENSION, MILD 02/04/2008   Past Surgical History  Procedure Laterality Date  . Appendectomy     History   Social History  . Marital Status: Married    Spouse Name: N/A    Number of Children: 1  . Years of Education: N/A   Occupational History  . retired Occupational hygienist     Social History Main Topics  . Smoking status: Never Smoker   . Smokeless tobacco: Never Used  . Alcohol Use: Yes     Comment: SOCIALLY  . Drug Use: No  . Sexual Activity: Not on file   Other Topics Concern  . 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 Heart disease Father 83    MI  . Hypertension Neg Hx   . Diabetes Neg Hx   . Heart disease Other     GRANDFATHER  . Cancer Maternal Aunt     PANCREATIC    Review of Systems  No  CP, SOB,  lower extremity edema Denies  nausea, vomiting diarrhea Denies  blood in the stools No dysphagia or odynophagia (-) cough, sputum production No dysuria, gross hematuria, difficulty urinating       Objective:   Physical Exam  BP 129/86  Pulse 67  Temp(Src) 98.4 F (36.9 C)  Ht 5' 11.9" (1.826 m)  Wt 221 lb (100.245 kg)  BMI 30.06 kg/m2  SpO2 98% General -- alert, well-developed, NAD.  Neck --no thyromegaly , normal carotid pulse Lungs -- normal respiratory effort, no intercostal retractions, no accessory muscle use, and normal breath sounds.  Heart-- normal rate, regular rhythm, no murmur.  Abdomen-- Not distended, good bowel sounds,soft, non-tender. Rectal-- No external abnormalities noted. Normal sphincter tone. No rectal masses or tenderness. Brown stool, Hemoccult negative  Prostate--Prostate gland firm and smooth, no enlargement, minute nodule on the R (2 mm)? Extremities-- no pretibial edema bilaterally  Neurologic--  alert & oriented X3. Speech normal, gait normal, strength normal in all extremities.   Psych-- Cognition and judgment appear intact. Cooperative with normal attention span and concentration. No anxious appearing , no depressed appearing.      Assessment & Plan:

## 2013-03-11 LAB — AST: AST: 24 U/L (ref 0–37)

## 2013-03-11 LAB — BASIC METABOLIC PANEL
CO2: 27 mEq/L (ref 19–32)
Calcium: 9.4 mg/dL (ref 8.4–10.5)
Creatinine, Ser: 0.7 mg/dL (ref 0.4–1.5)
Potassium: 4.1 mEq/L (ref 3.5–5.1)
Sodium: 139 mEq/L (ref 135–145)

## 2013-03-14 ENCOUNTER — Encounter: Payer: Self-pay | Admitting: *Deleted

## 2013-05-08 ENCOUNTER — Other Ambulatory Visit: Payer: Self-pay | Admitting: Internal Medicine

## 2013-05-09 NOTE — Telephone Encounter (Signed)
Telmisartan refilled per protocol

## 2013-05-30 ENCOUNTER — Other Ambulatory Visit: Payer: Self-pay | Admitting: Internal Medicine

## 2013-05-31 NOTE — Telephone Encounter (Signed)
rx refilled per protocol. DJR  

## 2013-06-28 ENCOUNTER — Other Ambulatory Visit: Payer: Self-pay | Admitting: *Deleted

## 2013-06-28 MED ORDER — ATORVASTATIN CALCIUM 40 MG PO TABS: ORAL_TABLET | ORAL | Status: DC

## 2013-07-04 ENCOUNTER — Other Ambulatory Visit: Payer: Self-pay | Admitting: Internal Medicine

## 2013-07-04 NOTE — Telephone Encounter (Signed)
Amlodipine refilled per protocol. JG//CMA 

## 2013-09-08 ENCOUNTER — Ambulatory Visit: Payer: Medicare Other | Admitting: Internal Medicine

## 2013-09-22 ENCOUNTER — Ambulatory Visit (INDEPENDENT_AMBULATORY_CARE_PROVIDER_SITE_OTHER): Payer: Medicare Other | Admitting: Internal Medicine

## 2013-09-22 ENCOUNTER — Encounter: Payer: Self-pay | Admitting: Internal Medicine

## 2013-09-22 VITALS — BP 107/71 | HR 78 | Temp 97.8°F | Wt 226.0 lb

## 2013-09-22 DIAGNOSIS — Z0000 Encounter for general adult medical examination without abnormal findings: Secondary | ICD-10-CM

## 2013-09-22 DIAGNOSIS — I10 Essential (primary) hypertension: Secondary | ICD-10-CM

## 2013-09-22 DIAGNOSIS — E785 Hyperlipidemia, unspecified: Secondary | ICD-10-CM

## 2013-09-22 LAB — BASIC METABOLIC PANEL
BUN: 15 mg/dL (ref 6–23)
CALCIUM: 9.2 mg/dL (ref 8.4–10.5)
CO2: 20 mEq/L (ref 19–32)
CREATININE: 1 mg/dL (ref 0.4–1.5)
Chloride: 109 mEq/L (ref 96–112)
GFR: 81.82 mL/min (ref >60.00)
Glucose, Bld: 97 mg/dL (ref 70–99)
Potassium: 4.5 mEq/L (ref 3.5–5.1)
Sodium: 140 mEq/L (ref 135–145)

## 2013-09-22 LAB — LIPID PANEL
CHOLESTEROL: 114 mg/dL (ref 0–200)
HDL: 32.2 mg/dL — ABNORMAL LOW (ref >39.00)
LDL Cholesterol: 64 mg/dL (ref 0–99)
TRIGLYCERIDES: 91 mg/dL (ref 0.0–149.0)
Total CHOL/HDL Ratio: 4
VLDL: 18.2 mg/dL (ref 0.0–40.0)

## 2013-09-22 NOTE — Patient Instructions (Signed)
Get your blood work before you leave   Next visit is for a physical exam by October 2015,  fasting Please make an appointment    

## 2013-09-22 NOTE — Assessment & Plan Note (Signed)
Labs

## 2013-09-22 NOTE — Progress Notes (Signed)
Pre visit review using our clinic review tool, if applicable. No additional management support is needed unless otherwise documented below in the visit note. 

## 2013-09-22 NOTE — Progress Notes (Signed)
   Subjective:    Patient ID: [name], male    DOB: Aug 28, 1945, 68 y.o.   MRN: [id number]  DOS:  09/22/2013 Type of  visit: Routine office visit Hypertension, good medication compliance, BP today is very good. High cholesterol, good medication compliance. Had a prostate nodule, status post urology eval. See assessment and plan   ROS No chest pain, difficulty breathing or lower extremity edema. No unusual aches or pains  Past Medical History  Diagnosis Date  . Hyperlipidemia   . Kidney stones   . Barrett's esophagus   . HYPERTENSION, MILD 02/04/2008    Past Surgical History  Procedure Laterality Date  . Appendectomy      History   Social History  . Marital Status: Married    Spouse Name: N/A    Number of Children: 1  . Years of Education: N/A   Occupational History  . retired Insurance underwriter     Social History Main Topics  . Smoking status: Never Smoker   . Smokeless tobacco: Never Used  . Alcohol Use: Yes     Comment: SOCIALLY  . Drug Use: No  . Sexual Activity: Not on file   Other Topics Concern  . 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  Always use your most recent med list.               amLODipine 5 MG tablet  Commonly known as:  NORVASC  TAKE 1 TABLET BY MOUTH EVERY DAY     aspirin 81 MG tablet  Take 81 mg by mouth daily.     atorvastatin 40 MG tablet  Commonly known as:  LIPITOR  TAKE 1 TABLET BY MOUTH DAILY     lansoprazole 30 MG capsule  Commonly known as:  PREVACID  TAKE 1 CAPSULE (30 MG TOTAL) BY MOUTH DAILY.     telmisartan 40 MG tablet  Commonly known as:  MICARDIS  TAKE 1 TABLET (40 MG TOTAL) BY MOUTH DAILY.           Objective:   Physical Exam BP 107/71  Pulse 78  Temp(Src) 97.8 F (36.6 C)  Wt 226 lb (102.513 kg)  SpO2 97% General -- alert,  well-developed, NAD.  Lungs -- normal respiratory effort, no intercostal retractions, no accessory muscle use, and normal breath sounds.  Heart-- normal rate, regular rhythm, no murmur.  Extremities-- no pretibial edema bilaterally  Psych-- Cognition and judgment appear intact. Cooperative with normal attention span and concentration. No anxious or depressed appearing.        Assessment & Plan:

## 2013-09-22 NOTE — Assessment & Plan Note (Signed)
Since the last time he was here, saw urology twice, PSA according to the patient was trending down. Urology felt that he probably had a small calcified area in the prostate. They agree to return to urology when necessary, will continue doing yearly screening is here.

## 2013-09-22 NOTE — Assessment & Plan Note (Addendum)
Seems to be doing very well, check a BMP. 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 EKG today nsr

## 2013-09-26 ENCOUNTER — Encounter: Payer: Self-pay | Admitting: *Deleted

## 2013-11-05 ENCOUNTER — Other Ambulatory Visit: Payer: Self-pay | Admitting: Internal Medicine

## 2013-12-24 ENCOUNTER — Other Ambulatory Visit: Payer: Self-pay | Admitting: Internal Medicine

## 2014-01-22 ENCOUNTER — Other Ambulatory Visit: Payer: Self-pay | Admitting: Internal Medicine

## 2014-03-02 ENCOUNTER — Other Ambulatory Visit: Payer: Self-pay

## 2014-03-02 MED ORDER — LANSOPRAZOLE 30 MG PO CPDR: DELAYED_RELEASE_CAPSULE | ORAL | Status: DC

## 2014-03-14 ENCOUNTER — Ambulatory Visit (INDEPENDENT_AMBULATORY_CARE_PROVIDER_SITE_OTHER): Payer: Medicare Other | Admitting: Internal Medicine

## 2014-03-14 ENCOUNTER — Encounter: Payer: Self-pay | Admitting: Internal Medicine

## 2014-03-14 VITALS — BP 138/82 | HR 75 | Temp 98.4°F | Ht 72.0 in | Wt 227.1 lb

## 2014-03-14 DIAGNOSIS — I10 Essential (primary) hypertension: Secondary | ICD-10-CM

## 2014-03-14 DIAGNOSIS — E785 Hyperlipidemia, unspecified: Secondary | ICD-10-CM

## 2014-03-14 DIAGNOSIS — Z23 Encounter for immunization: Secondary | ICD-10-CM

## 2014-03-14 DIAGNOSIS — Z0000 Encounter for general adult medical examination without abnormal findings: Secondary | ICD-10-CM

## 2014-03-14 DIAGNOSIS — N402 Nodular prostate without lower urinary tract symptoms: Secondary | ICD-10-CM

## 2014-03-14 DIAGNOSIS — K2270 Barrett's esophagus without dysplasia: Secondary | ICD-10-CM

## 2014-03-14 NOTE — Patient Instructions (Signed)
Get your blood work before you leave   Check the  blood pressure 2   times a month   Be sure your blood pressure is between  140/85 and 110/65.  if it is consistently higher or lower, let me know    Please come back to the office in 1 year for a physical exam. Come back fasting     REMINDERS It is extremely important to know what medications you take, please bring all bottles with you (or an accurate medication list) for every visit  If we are ordering labs, XRs or referring you to a specialist : we will always communicate to you the results or the time of the appointment within few days. If you don't hear from [place] please call the office     Fall Prevention and Home Safety Falls cause injuries and can affect all age groups. It is possible to use preventive measures to significantly decrease the likelihood of falls. There are many simple measures which can make your home safer and prevent falls. OUTDOORS  Repair cracks and edges of walkways and driveways.  Remove high doorway thresholds.  Trim shrubbery on the main path into your home.  Have good outside lighting.  Clear walkways of tools, rocks, debris, and clutter.  Check that handrails are not broken and are securely fastened. Both sides of steps should have handrails.  Have leaves, snow, and ice cleared regularly.  Use sand or salt on walkways during winter months.  In the garage, clean up grease or oil spills. BATHROOM  Install night lights.  Install grab bars by the toilet and in the tub and shower.  Use non-skid mats or decals in the tub or shower.  Place a plastic non-slip stool in the shower to sit on, if needed.  Keep floors dry and clean up all water on the floor immediately.  Remove soap buildup in the tub or shower on a regular basis.  Secure bath mats with non-slip, double-sided rug tape.  Remove throw rugs and tripping hazards from the floors. BEDROOMS  Install night lights.  Make sure a  bedside light is easy to reach.  Do not use oversized bedding.  Keep a telephone by your bedside.  Have a firm chair with side arms to use for getting dressed.  Remove throw rugs and tripping hazards from the floor. KITCHEN  Keep handles on pots and pans turned toward the center of the stove. Use back burners when possible.  Clean up spills quickly and allow time for drying.  Avoid walking on wet floors.  Avoid hot utensils and knives.  Position shelves so they are not too high or low.  Place commonly used objects within easy reach.  If necessary, use a sturdy step stool with a grab bar when reaching.  Keep electrical cables out of the way.  Do not use floor polish or wax that makes floors slippery. If you must use wax, use non-skid floor wax.  Remove throw rugs and tripping hazards from the floor. STAIRWAYS  Never leave objects on stairs.  Place handrails on both sides of stairways and use them. Fix any loose handrails. Make sure handrails on both sides of the stairways are as long as the stairs.  Check carpeting to make sure it is firmly attached along stairs. Make repairs to worn or loose carpet promptly.  Avoid placing throw rugs at the top or bottom of stairways, or properly secure the rug with carpet tape to prevent slippage. Get rid  of throw rugs, if possible.  Have an electrician put in a light switch at the top and bottom of the stairs. OTHER FALL PREVENTION TIPS  Wear low-heel or rubber-soled shoes that are supportive and fit well. Wear closed toe shoes.  When using a stepladder, make sure it is fully opened and both spreaders are firmly locked. Do not climb a closed stepladder.  Add color or contrast paint or tape to grab bars and handrails in your home. Place contrasting color strips on first and last steps.  Learn and use mobility aids as needed. Install an electrical emergency response system.  Turn on lights to avoid dark areas. Replace light bulbs  that burn out immediately. Get light switches that glow.  Arrange furniture to create clear pathways. Keep furniture in the same place.  Firmly attach carpet with non-skid or double-sided tape.  Eliminate uneven floor surfaces.  Select a carpet pattern that does not visually hide the edge of steps.  Be aware of all pets. OTHER HOME SAFETY TIPS  Set the water temperature for 120 F (48.8 C).  Keep emergency numbers on or near the telephone.  Keep smoke detectors on every level of the home and near sleeping areas. Document Released: 05/16/2002 Document Revised: 11/25/2011 Document Reviewed: 08/15/2011 [facility] Patient Information 2015 [place], [place]. This information is not intended to replace advice given to you by your health care provider. Make sure you discuss any questions you have with your health care provider.

## 2014-03-14 NOTE — Assessment & Plan Note (Signed)
Well controlled, continue present care, labs

## 2014-03-14 NOTE — Progress Notes (Signed)
Subjective:    Patient ID: [name], male    DOB: 12-25-1945, 68 y.o.   MRN: [id number]  DOS:  03/14/2014 Type of visit - description :   Here for Medicare AWV: 1. Risk factors based on Past M, S, F history: reviewed 2. Physical Activities: walks regularly, some golf 3. Depression/mood: neg screening   4. Hearing: No problemss noted or reported   5. ADL's:  Independent   6. Fall Risk: no recent fall, see instructions   7. home Safety: does feel safe at home   8. Height, weight, & visual acuity: see VS, sees eye MD q year   9. Counseling: provided 10. Labs ordered based on risk factors: if needed   11. Referral Coordination: if needed 12. Care Plan, see assessment and plan   13. Cognitive Assessment: motor skills and cognition intact   In addition, today we discussed the following: High cholesterol, good medication compliance, patient is fasting. Hypertension, good medication compliance, ambulatory BPs 120/70 Barretts esophagus, good compliance with PPIs, no symptoms.    ROS Denies chest pain, difficulty breathing or lower extremity edema No nausea, vomiting, diarrhea or blood in the stools. No heartburn. No dysuria, gross hematuria or difficulty urinating  Past Medical History  Diagnosis Date  . Hyperlipidemia   . Kidney stones   . Barrett's esophagus   . HYPERTENSION, MILD 02/04/2008    Past Surgical History  Procedure Laterality Date  . Appendectomy      History   Social History  . Marital Status: Married    Spouse Name: N/A    Number of Children: 1  . Years of Education: N/A   Occupational History  . retired Insurance underwriter     Social History Main Topics  . Smoking status: Never Smoker   . Smokeless tobacco: Never Used  . Alcohol Use: Yes     Comment: SOCIALLY  . Drug Use: No  . Sexual Activity: Not on file   Other Topics Concern  . 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 Heart disease Father 67    MI  . Hypertension Neg Hx   . Diabetes Neg Hx   . Heart disease Other     GRANDFATHER  . Cancer Maternal Aunt     PANCREATIC  . Colon cancer Neg Hx   . Prostate cancer Neg Hx   . Breast cancer Mother     dx age 28       Medication List       This list is accurate as of: 03/14/14 10:08 AM.  Always use your most recent med list.               amLODipine 5 MG tablet  Commonly known as:  NORVASC  TAKE 1 TABLET BY MOUTH EVERY DAY     aspirin 81 MG tablet  Take 81 mg by mouth daily.     atorvastatin 40 MG tablet  Commonly known as:  LIPITOR  TAKE 1 TABLET BY MOUTH DAILY     lansoprazole 30 MG capsule  Commonly known as:  PREVACID  TAKE 1 CAPSULE (30 MG TOTAL) BY MOUTH DAILY.     telmisartan 40 MG tablet  Commonly known as:  MICARDIS  TAKE 1 TABLET (40 MG TOTAL) BY MOUTH DAILY.  Objective:   Physical Exam BP 138/82  Pulse 75  Temp(Src) 98.4 F (36.9 C) (Oral)  Ht 6' (1.829 m)  Wt 227 lb 2 oz (103.023 kg)  BMI 30.80 kg/m2  SpO2 98% General -- alert, well-developed, NAD.  Neck --no thyromegaly , normal carotid pulse  HEENT-- Not pale.   Lungs -- normal respiratory effort, no intercostal retractions, no accessory muscle use, and normal breath sounds.  Heart-- normal rate, regular rhythm, no murmur.  Abdomen-- Not distended, good bowel sounds,soft, non-tender. No bruit . Extremities-- no pretibial edema bilaterally  Neurologic--  alert & oriented X3. Speech normal, gait appropriate for age, strength symmetric and appropriate for age.  Psych-- Cognition and judgment appear intact. Cooperative with normal attention span and concentration. No anxious or depressed appearing.      Assessment & Plan:

## 2014-03-14 NOTE — Assessment & Plan Note (Addendum)
Minute nodule on [id number], saw  urology twice. Urology felt that he probably had a small calcified area in the prostate. Last visit w/ urology was 06/2013, DRE was stable, PSA was 0.6. They agree to return to urology when necessary  Plan: Check a PSA

## 2014-03-14 NOTE — Assessment & Plan Note (Signed)
Doing great, due for EGD 02/2015

## 2014-03-14 NOTE — Assessment & Plan Note (Signed)
Good compliance of medication, lfts stable, check FLP

## 2014-03-14 NOTE — Progress Notes (Signed)
Pre visit review using our clinic review tool, if applicable. No additional management support is needed unless otherwise documented below in the visit note. 

## 2014-03-14 NOTE — Assessment & Plan Note (Signed)
Td 08  Shingle shot 05/2010  Flu shot -- regular vs high dose , elected regular  pneumonia shot 06, 2012  prevnar today had a Cscope 2001 and 02-2010, and next in 10 years  life style discussed

## 2014-03-15 LAB — BASIC METABOLIC PANEL
BUN: 17 mg/dL (ref 6–23)
CHLORIDE: 108 meq/L (ref 96–112)
CO2: 24 mEq/L (ref 19–32)
CREATININE: 0.7 mg/dL (ref 0.4–1.5)
Calcium: 9.4 mg/dL (ref 8.4–10.5)
GFR: 117.12 mL/min (ref >60.00)
GLUCOSE: 92 mg/dL (ref 70–99)
POTASSIUM: 4.2 meq/L (ref 3.5–5.1)
Sodium: 140 mEq/L (ref 135–145)

## 2014-03-15 LAB — LIPID PANEL
Cholesterol: 131 mg/dL (ref 0–200)
HDL: 35.2 mg/dL — ABNORMAL LOW (ref >39.00)
LDL CALC: 74 mg/dL (ref 0–99)
NonHDL: 95.8
Total CHOL/HDL Ratio: 4
Triglycerides: 111 mg/dL (ref 0.0–149.0)
VLDL: 22.2 mg/dL (ref 0.0–40.0)

## 2014-03-15 LAB — PSA: PSA: 0.77 ng/mL (ref 0.10–4.00)

## 2014-05-15 ENCOUNTER — Telehealth: Payer: Self-pay | Admitting: Internal Medicine

## 2014-05-15 ENCOUNTER — Other Ambulatory Visit: Payer: Self-pay

## 2014-05-15 MED ORDER — TELMISARTAN 40 MG PO TABS: ORAL_TABLET | ORAL | Status: DC

## 2014-05-15 NOTE — Telephone Encounter (Signed)
Caller name: rajveer Relation to pt: self Call back number: [phone] [facility] [place]  Reason for call:   Patient needs refills of micardis, 90 day supply

## 2014-05-15 NOTE — Telephone Encounter (Signed)
[facility] ref[facility] [place], # 90 and 2 refills.

## 2014-06-28 ENCOUNTER — Other Ambulatory Visit: Payer: Self-pay | Admitting: Internal Medicine

## 2014-07-17 ENCOUNTER — Other Ambulatory Visit: Payer: Self-pay | Admitting: Internal Medicine

## 2014-07-18 ENCOUNTER — Telehealth: Payer: Self-pay | Admitting: *Deleted

## 2014-07-18 NOTE — Telephone Encounter (Signed)
Prior authorization initiated for lansoprazole. Awaiting determination from [facility]. JG//CMA

## 2014-08-07 NOTE — Telephone Encounter (Signed)
PA for lansoprazole was denied. Covered alternatives are listed below. Please advise. JG//CMA

## 2014-08-07 NOTE — Telephone Encounter (Addendum)
Caller name: [name], [name] Relation to pt: self  Call back number: [phone] [id number] [place], [place] [phone]) [phone] (Fax)       Reason for call:  Pt requesting 90 day supply of omeprazole or pantoprazole please advise

## 2014-08-07 NOTE — Telephone Encounter (Signed)
Okay pantoprazole 40 mg instead, 1 by mouth daily   #90 and 3 refills

## 2014-08-08 MED ORDER — PANTOPRAZOLE SODIUM 40 MG PO TBEC: 40.0000 mg | DELAYED_RELEASE_TABLET | Freq: Every day | ORAL | Status: DC

## 2014-08-08 NOTE — Telephone Encounter (Signed)
Med e-scribed to pharmacy. JG//CMA 

## 2014-08-08 NOTE — Addendum Note (Signed)
Addended by: Murtis Sink A on: 08/08/2014 07:51 AM   Modules accepted: Orders, Medications

## 2015-02-15 ENCOUNTER — Other Ambulatory Visit: Payer: Self-pay

## 2015-02-20 ENCOUNTER — Other Ambulatory Visit: Payer: Self-pay | Admitting: Internal Medicine

## 2015-03-14 ENCOUNTER — Telehealth: Payer: Self-pay

## 2015-03-14 NOTE — Telephone Encounter (Signed)
Pre visit call completed 

## 2015-03-15 ENCOUNTER — Ambulatory Visit (INDEPENDENT_AMBULATORY_CARE_PROVIDER_SITE_OTHER): Payer: Medicare Other | Admitting: Internal Medicine

## 2015-03-15 ENCOUNTER — Encounter: Payer: Self-pay | Admitting: Internal Medicine

## 2015-03-15 VITALS — BP 126/72 | HR 70 | Temp 97.6°F | Ht 71.5 in | Wt 224.1 lb

## 2015-03-15 DIAGNOSIS — I10 Essential (primary) hypertension: Secondary | ICD-10-CM

## 2015-03-15 DIAGNOSIS — Z09 Encounter for follow-up examination after completed treatment for conditions other than malignant neoplasm: Secondary | ICD-10-CM | POA: Insufficient documentation

## 2015-03-15 DIAGNOSIS — K22719 Barrett's esophagus with dysplasia, unspecified: Secondary | ICD-10-CM

## 2015-03-15 DIAGNOSIS — Z23 Encounter for immunization: Secondary | ICD-10-CM

## 2015-03-15 DIAGNOSIS — E785 Hyperlipidemia, unspecified: Secondary | ICD-10-CM

## 2015-03-15 DIAGNOSIS — N402 Nodular prostate without lower urinary tract symptoms: Secondary | ICD-10-CM | POA: Diagnosis not present

## 2015-03-15 DIAGNOSIS — Z0000 Encounter for general adult medical examination without abnormal findings: Secondary | ICD-10-CM

## 2015-03-15 LAB — COMPREHENSIVE METABOLIC PANEL
ALT: 26 U/L (ref 0–53)
AST: 17 U/L (ref 0–37)
Albumin: 4.5 g/dL (ref 3.5–5.2)
Alkaline Phosphatase: 93 U/L (ref 39–117)
BILIRUBIN TOTAL: 0.9 mg/dL (ref 0.2–1.2)
BUN: 15 mg/dL (ref 6–23)
CALCIUM: 9.4 mg/dL (ref 8.4–10.5)
CHLORIDE: 107 meq/L (ref 96–112)
CO2: 26 meq/L (ref 19–32)
Creatinine, Ser: 0.84 mg/dL (ref 0.40–1.50)
GFR: 96.18 mL/min (ref >60.00)
Glucose, Bld: 99 mg/dL (ref 70–99)
POTASSIUM: 4.1 meq/L (ref 3.5–5.1)
Sodium: 141 mEq/L (ref 135–145)
Total Protein: 7.1 g/dL (ref 6.0–8.3)

## 2015-03-15 LAB — CBC WITH DIFFERENTIAL/PLATELET
Basophils Absolute: 0 10*3/uL (ref 0.0–0.1)
Basophils Relative: 0.4 % (ref 0.0–3.0)
EOS ABS: 0.1 10*3/uL (ref 0.0–0.7)
Eosinophils Relative: 2.1 % (ref 0.0–5.0)
HCT: 47.7 % (ref 39.0–52.0)
HEMOGLOBIN: 16 g/dL (ref 13.0–17.0)
Lymphocytes Relative: 23.2 % (ref 12.0–46.0)
Lymphs Abs: 1.2 10*3/uL (ref 0.7–4.0)
MCHC: 33.6 g/dL (ref 30.0–36.0)
MCV: 90.1 fl (ref 78.0–100.0)
MONO ABS: 0.4 10*3/uL (ref 0.1–1.0)
Monocytes Relative: 8.2 % (ref 3.0–12.0)
Neutro Abs: 3.4 10*3/uL (ref 1.4–7.7)
Neutrophils Relative %: 66.1 % (ref 43.0–77.0)
Platelets: 229 10*3/uL (ref 150.0–400.0)
RBC: 5.29 Mil/uL (ref 4.22–5.81)
RDW: 13.8 % (ref 11.5–15.5)
WBC: 5.1 10*3/uL (ref 4.0–10.5)

## 2015-03-15 LAB — LIPID PANEL
Cholesterol: 121 mg/dL (ref 0–200)
HDL: 36 mg/dL — ABNORMAL LOW (ref >39.00)
LDL CALC: 66 mg/dL (ref 0–99)
NONHDL: 84.54
Total CHOL/HDL Ratio: 3
Triglycerides: 93 mg/dL (ref 0.0–149.0)
VLDL: 18.6 mg/dL (ref 0.0–40.0)

## 2015-03-15 LAB — TSH: TSH: 0.92 u[IU]/mL (ref 0.35–4.50)

## 2015-03-15 LAB — PSA: PSA: 1.19 ng/mL (ref 0.10–4.00)

## 2015-03-15 MED ORDER — PANTOPRAZOLE SODIUM 40 MG PO TBEC: 40.0000 mg | DELAYED_RELEASE_TABLET | Freq: Every day | ORAL | Status: DC

## 2015-03-15 MED ORDER — AMLODIPINE BESYLATE 5 MG PO TABS: 5.0000 mg | ORAL_TABLET | Freq: Every day | ORAL | Status: DC

## 2015-03-15 MED ORDER — ATORVASTATIN CALCIUM 40 MG PO TABS: 40.0000 mg | ORAL_TABLET | Freq: Every day | ORAL | Status: DC

## 2015-03-15 MED ORDER — TELMISARTAN 40 MG PO TABS: 40.0000 mg | ORAL_TABLET | Freq: Every day | ORAL | Status: DC

## 2015-03-15 NOTE — Assessment & Plan Note (Addendum)
Td 08 ;Shingle shot 05/2010 ; pnm 23: 2012; prevnar: 2015; had a flu shot CCS: had a Cscope 2001 and 02-2010, and next in 10 years  Prostate cancer screening: DRE with a small stable nodule. Check a PSA life style discussed

## 2015-03-15 NOTE — Assessment & Plan Note (Signed)
Barrett's esophagus: Refer to GI, I believe he is due for a EGD HTN: Continue same meds, check a CMP, CBC and TSH Hyperlipidemia: Due for labs, continue statins RTC one year

## 2015-03-15 NOTE — Progress Notes (Signed)
Subjective:    Patient ID: [name], male    DOB: 1946-03-03, 69 y.o.   MRN: [id number]  DOS:  03/15/2015 Type of visit - description :   Here for Medicare AWV: 1. Risk factors based on Past M, S, F history: reviewed 2. Physical Activities: walks regularly, some golf 3. Depression/mood: neg screening   4. Hearing: No problemss noted or reported   5. ADL's:  Independent   6. Fall Risk: no recent fall, see instructions   7. home Safety: does feel safe at home   8. Height, weight, & visual acuity: see VS, sees eye MD q year   9. Counseling: provided 10. Labs ordered based on risk factors: if needed   11. Referral Coordination: if needed 12. Care Plan, see assessment and plan   13. Cognitive Assessment: motor skills and cognition intact  14. Providers list updated  15. End of life care discussed  In addition, today we discussed the following: Hypertension: No ambulatory BPs, good compliance of medication, BP today is great High cholesterol, good compliance of medication. Barrett's - Asymptomatic  Review of Systems Constitutional: No fever. No chills. No unexplained wt changes. No unusual sweats  HEENT: No dental problems, no ear discharge, no facial swelling, no voice changes. No eye discharge, no eye  redness , no  intolerance to light   Respiratory: No wheezing , no  difficulty breathing. No cough , no mucus production  Cardiovascular: No CP, no leg swelling , no  Palpitations  GI: no nausea, no vomiting, no diarrhea , no  abdominal pain.  No blood in the stools. No dysphagia, no odynophagia    Endocrine: No polyphagia, no polyuria , no polydipsia  GU: No dysuria, gross hematuria, difficulty urinating. No urinary urgency, no frequency.  Musculoskeletal: Occasional low back pain, saw Dr. Nelva Bush, had po  prednisone, better  Skin: No change in the color of the skin, palor , no  Rash  Allergic, immunologic: No environmental allergies , no  food  allergies  Neurological: No dizziness no  syncope. No headaches. No diplopia, no slurred, no slurred speech, no motor deficits, no facial  Numbness  Hematological: No enlarged lymph nodes, no easy bruising , no unusual bleedings  Psychiatry: No suicidal ideas, no hallucinations, no beavior problems, no confusion.  No unusual/severe anxiety, no depression   Past Medical History  Diagnosis Date  . Hyperlipidemia   . Kidney stones   . Barrett's esophagus   . HYPERTENSION, MILD 02/04/2008  . Prostate nodule     Past Surgical History  Procedure Laterality Date  . Appendectomy      Social History   Social History  . Marital Status: Married    Spouse Name: N/A  . Number of Children: 1  . Years of Education: N/A   Occupational History  . retired Insurance underwriter     Social History Main Topics  . Smoking status: Never Smoker   . Smokeless tobacco: Never Used  . Alcohol Use: Yes     Comment: SOCIALLY  . Drug Use: No  . Sexual Activity: Not on file   Other Topics Concern  . 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 Heart disease Father 70    MI  . Hypertension Neg Hx   .  Diabetes Neg Hx   . Heart disease Other     GRANDFATHER  . Cancer Maternal Aunt     PANCREATIC  . Colon cancer Neg Hx   . Prostate cancer Neg Hx   . Breast cancer Mother     dx age 26        Medication List       This list is accurate as of: 03/15/15  4:03 PM.  Always use your most recent med list.               amLODipine 5 MG tablet  Commonly known as:  NORVASC  Take 1 tablet (5 mg total) by mouth daily.     aspirin 81 MG tablet  Take 81 mg by mouth daily.     atorvastatin 40 MG tablet  Commonly known as:  LIPITOR  Take 1 tablet (40 mg total) by mouth daily.     pantoprazole 40 MG tablet  Commonly known as:  PROTONIX  Take 1 tablet (40  mg total) by mouth daily.     telmisartan 40 MG tablet  Commonly known as:  MICARDIS  Take 1 tablet (40 mg total) by mouth daily.           Objective:   Physical Exam BP 126/72 mmHg  Pulse 70  Temp(Src) 97.6 F (36.4 C) (Oral)  Ht 5' 11.5" (1.816 m)  Wt 224 lb 2 oz (101.662 kg)  BMI 30.83 kg/m2  SpO2 97% General:   Well developed, well nourished . NAD.  Neck:  No thyromegaly, normal carotid pulses  HEENT:  Normocephalic . Face symmetric, atraumatic Lungs:  CTA B Normal respiratory effort, no intercostal retractions, no accessory muscle use. Heart: RRR,  no murmur.  No pretibial edema bilaterally  Abdomen:  Not distended, soft, non-tender. No rebound or rigidity.  Rectal:  External abnormalities: none. Normal sphincter tone. No rectal masses or tenderness.  No stools  Prostate: Prostate gland firm and smooth, no enlargement, minute no TTP nodule on the right Skin: Exposed areas without rash. Not pale. Not jaundice Neurologic:  alert & oriented X3.  Speech normal, gait appropriate for age and unassisted Strength symmetric and appropriate for age.  Psych: Cognition and judgment appear intact.  Cooperative with normal attention span and concentration.  Behavior appropriate. No anxious or depressed appearing.    Assessment & Plan:   Assessment> HTN Hyperlipidemia Barrett's esophagus due EGD ~ 02-2015 R Prostate nodule, saw urology ~ 2014 ,small Ca++ area, no Bx H/o urolithiasis Insomnia Back pain: On and off, saw Dr Nelva Bush ~08-2014, MRI done, rx conservative treatment +FH CAD father age 54   Plan: Barrett's esophagus: Refer to GI, I believe he is due for a EGD HTN: Continue same meds, check a CMP, CBC and TSH Hyperlipidemia: Due for labs, continue statins RTC one year

## 2015-03-15 NOTE — Progress Notes (Signed)
Pre visit review using our clinic review tool, if applicable. No additional management support is needed unless otherwise documented below in the visit note. 

## 2015-03-15 NOTE — Patient Instructions (Signed)
Get your blood work before you leave    Check the  blood pressure   Monthly  Be sure your blood pressure is between 110/65 and  145/85.  if it is consistently higher or lower, let me know   Next visit  for a  complete physical exam in one year, fasting Please schedule an appointment at the front desk     Fall Prevention and [place] cause injuries and can affect all age groups. It is possible to use preventive measures to significantly decrease the likelihood of falls. There are many simple measures which can make your home safer and prevent falls. OUTDOORS  Repair cracks and edges of walkways and driveways.  Remove high doorway thresholds.  Trim shrubbery on the main path into your home.  Have good outside lighting.  Clear walkways of tools, rocks, debris, and clutter.  Check that handrails are not broken and are securely fastened. Both sides of steps should have handrails.  Have leaves, snow, and ice cleared regularly.  Use sand or salt on walkways during winter months.  In the garage, clean up grease or oil spills. BATHROOM  Install night lights.  Install grab bars by the toilet and in the tub and shower.  Use non-skid mats or decals in the tub or shower.  Place a plastic non-slip stool in the shower to sit on, if needed.  Keep floors dry and clean up all water on the floor immediately.  Remove soap buildup in the tub or shower on a regular basis.  Secure bath mats with non-slip, double-sided rug tape.  Remove throw rugs and tripping hazards from the floors. BEDROOMS  Install night lights.  Make sure a bedside light is easy to reach.  Do not use oversized bedding.  Keep a telephone by your bedside.  Have a firm chair with side arms to use for getting dressed.  Remove throw rugs and tripping hazards from the floor. KITCHEN  Keep handles on pots and pans turned toward the center of the stove. Use back burners when possible.  Clean up spills  quickly and allow time for drying.  Avoid walking on wet floors.  Avoid hot utensils and knives.  Position shelves so they are not too high or low.  Place commonly used objects within easy reach.  If necessary, use a sturdy step stool with a grab bar when reaching.  Keep electrical cables out of the way.  Do not use floor polish or wax that makes floors slippery. If you must use wax, use non-skid floor wax.  Remove throw rugs and tripping hazards from the floor. STAIRWAYS  Never leave objects on stairs.  Place handrails on both sides of stairways and use them. Fix any loose handrails. Make sure handrails on both sides of the stairways are as long as the stairs.  Check carpeting to make sure it is firmly attached along stairs. Make repairs to worn or loose carpet promptly.  Avoid placing throw rugs at the top or bottom of stairways, or properly secure the rug with carpet tape to prevent slippage. Get rid of throw rugs, if possible.  Have an electrician put in a light switch at the top and bottom of the stairs. OTHER FALL PREVENTION TIPS  Wear low-heel or rubber-soled shoes that are supportive and fit well. Wear closed toe shoes.  When using a stepladder, make sure it is fully opened and both spreaders are firmly locked. Do not climb a closed stepladder.  Add color or  contrast paint or tape to grab bars and handrails in your home. Place contrasting color strips on first and last steps.  Learn and use mobility aids as needed. Install an electrical emergency response system.  Turn on lights to avoid dark areas. Replace light bulbs that burn out immediately. Get light switches that glow.  Arrange furniture to create clear pathways. Keep furniture in the same place.  Firmly attach carpet with non-skid or double-sided tape.  Eliminate uneven floor surfaces.  Select a carpet pattern that does not visually hide the edge of steps.  Be aware of all pets. OTHER HOME SAFETY  TIPS  Set the water temperature for 120 F (48.8 C).  Keep emergency numbers on or near the telephone.  Keep smoke detectors on every level of the home and near sleeping areas. Document Released: 05/16/2002 Document Revised: 11/25/2011 Document Reviewed: 08/15/2011 [facility] Patient Information 2015 [place], [place]. This information is not intended to replace advice given to you by your health care provider. Make sure you discuss any questions you have with your health care provider.   Preventive Care for Adults Ages 34 and over  Blood pressure check.** / Every 1 to 2 years.  Lipid and cholesterol check.**/ Every 5 years beginning at age 58.  Lung cancer screening. / Every year if you are aged 28-80 years and have a 30-pack-year history of smoking and currently smoke or have quit within the past 15 years. Yearly screening is stopped once you have quit smoking for at least 15 years or develop a health problem that would prevent you from having lung cancer treatment.  Fecal occult blood test (FOBT) of stool. / Every year beginning at age 43 and continuing until age 34. You may not have to do this test if you get a colonoscopy every 10 years.  Flexible sigmoidoscopy** or colonoscopy.** / Every 5 years for a flexible sigmoidoscopy or every 10 years for a colonoscopy beginning at age 16 and continuing until age 106.  Hepatitis C blood test.** / For all people born from [birth year] through 1965 and any individual with known risks for hepatitis C.  Abdominal aortic aneurysm (AAA) screening.** / A one-time screening for ages 53 to 47 years who are current or former smokers.  Skin self-exam. / Monthly.  Influenza vaccine. / Every year.  Tetanus, diphtheria, and acellular pertussis (Tdap/Td) vaccine.** / 1 dose of Td every 10 years.  Varicella vaccine.** / Consult your health care provider.  Zoster vaccine.** / 1 dose for adults aged 33 years or older.  Pneumococcal 13-valent conjugate (PCV13)  vaccine.** / Consult your health care provider.  Pneumococcal polysaccharide (PPSV23) vaccine.** / 1 dose for all adults aged 24 years and older.  Meningococcal vaccine.** / Consult your health care provider.  Hepatitis A vaccine.** / Consult your health care provider.  Hepatitis B vaccine.** / Consult your health care provider.  Haemophilus influenzae type b (Hib) vaccine.** / Consult your health care provider. **Family history and personal history of risk and conditions may change your health care provider's recommendations. Document Released: 07/22/2001 Document Revised: 05/31/2013 Document Reviewed: 10/21/2010 [facility] Patient Information 2015 [place], [place]. This information is not intended to replace advice given to you by your health care provider. Make sure you discuss any questions you have with your health care provider.

## 2015-03-18 ENCOUNTER — Other Ambulatory Visit: Payer: Self-pay | Admitting: Internal Medicine

## 2015-04-08 ENCOUNTER — Other Ambulatory Visit: Payer: Self-pay | Admitting: Internal Medicine

## 2015-04-23 ENCOUNTER — Telehealth: Payer: Self-pay

## 2015-04-23 MED ORDER — AMLODIPINE BESYLATE 5 MG PO TABS: 5.0000 mg | ORAL_TABLET | Freq: Every day | ORAL | Status: DC

## 2015-04-23 NOTE — Telephone Encounter (Signed)
[facility] [place] [facility]2015 #90 and 3 refills. However, Rx sent again today.

## 2015-04-23 NOTE — Telephone Encounter (Signed)
Patient needing a refill for amLODipine (NORVASC) 5 MG tablet called his Pharmacy and they informed him he needed to contact patient was seen 03/15/15  And thought the refills were submitted then. Please advise

## 2015-05-07 ENCOUNTER — Encounter: Payer: Self-pay | Admitting: Internal Medicine

## 2015-05-18 ENCOUNTER — Other Ambulatory Visit: Payer: Self-pay | Admitting: Internal Medicine

## 2015-05-19 ENCOUNTER — Other Ambulatory Visit: Payer: Self-pay | Admitting: Internal Medicine

## 2015-06-07 ENCOUNTER — Ambulatory Visit (AMBULATORY_SURGERY_CENTER): Payer: Self-pay | Admitting: *Deleted

## 2015-06-07 VITALS — Ht 71.0 in | Wt 225.8 lb

## 2015-06-07 DIAGNOSIS — K2270 Barrett's esophagus without dysplasia: Secondary | ICD-10-CM

## 2015-06-07 NOTE — Progress Notes (Signed)
No egg or soy allergy known to patient  No issues with past sedation with any surgeries  or procedures, no intubation problems  No diet pills per patient No home 02 use per patient   emmi video declined

## 2015-06-20 ENCOUNTER — Encounter: Payer: Medicare Other | Admitting: Internal Medicine

## 2015-07-24 ENCOUNTER — Ambulatory Visit (AMBULATORY_SURGERY_CENTER): Payer: Medicare Other | Admitting: Internal Medicine

## 2015-07-24 ENCOUNTER — Encounter: Payer: Self-pay | Admitting: Internal Medicine

## 2015-07-24 VITALS — BP 135/87 | HR 73 | Temp 97.6°F | Resp 22 | Ht 71.0 in | Wt 225.0 lb

## 2015-07-24 DIAGNOSIS — K317 Polyp of stomach and duodenum: Secondary | ICD-10-CM | POA: Diagnosis not present

## 2015-07-24 DIAGNOSIS — K2270 Barrett's esophagus without dysplasia: Secondary | ICD-10-CM

## 2015-07-24 MED ORDER — SODIUM CHLORIDE 0.9 % IV SOLN: 500.0000 mL | INTRAVENOUS | Status: DC

## 2015-07-24 NOTE — Patient Instructions (Addendum)
I saw some stomach polyps and took biopsies - they do not look like a problem. I also biopsied the esophagus again re: Barrett's esophagus. Based upon recent updates you may not really meet criteria for Barrett's esophagus and may not need another endoscopy exam for this, and you may be able to stop the pantoprazole.  I appreciate the opportunity to care for you. Gatha Mayer, MD, FACG  YOU HAD AN ENDOSCOPIC PROCEDURE TODAY AT [facility]:   Refer to the procedure report that was given to you for any specific questions about what was found during the examination.  If the procedure report does not answer your questions, please call your gastroenterologist to clarify.  If you requested that your care partner not be given the details of your procedure findings, then the procedure report has been included in a sealed envelope for you to review at your convenience later.  YOU SHOULD EXPECT: Some feelings of bloating in the abdomen. Passage of more gas than usual.  Walking can help get rid of the air that was put into your GI tract during the procedure and reduce the bloating. If you had a lower endoscopy (such as a colonoscopy or flexible sigmoidoscopy) you may notice spotting of blood in your stool or on the toilet paper. If you underwent a bowel prep for your procedure, you may not have a normal bowel movement for a few days.  Please Note:  You might notice some irritation and congestion in your nose or some drainage.  This is from the oxygen used during your procedure.  There is no need for concern and it should clear up in a day or so.  SYMPTOMS TO REPORT IMMEDIATELY:   Following lower endoscopy (colonoscopy or flexible sigmoidoscopy):  Excessive amounts of blood in the stool  Significant tenderness or worsening of abdominal pains  Swelling of the abdomen that is new, acute  Fever of 100F or higher   Following upper endoscopy (EGD)  Vomiting of blood or coffee  ground material  New chest pain or pain under the shoulder blades  Painful or persistently difficult swallowing  New shortness of breath  Fever of 100F or higher  Black, tarry-looking stools  For urgent or emergent issues, a gastroenterologist can be reached at any hour by calling [phone].   DIET: Your first meal following the procedure should be a small meal and then it is ok to progress to your normal diet. Heavy or fried foods are harder to digest and may make you feel nauseous or bloated.  Likewise, meals heavy in dairy and vegetables can increase bloating.  Drink plenty of fluids but you should avoid alcoholic beverages for 24 hours.  ACTIVITY:  You should plan to take it easy for the rest of today and you should NOT DRIVE or use heavy machinery until tomorrow (because of the sedation medicines used during the test).    FOLLOW UP: Our staff will call the number listed on your records the next business day following your procedure to check on you and address any questions or concerns that you may have regarding the information given to you following your procedure. If we do not reach you, we will leave a message.  However, if you are feeling well and you are not experiencing any problems, there is no need to return our call.  We will assume that you have returned to your regular daily activities without incident.  If any biopsies were taken you  will be contacted by phone or by letter within the next 1-3 weeks.  Please call us at [phone] if you have not heard about the biopsies in 3 weeks.    SIGNATURES/CONFIDENTIALITY: You and/or your care partner have signed paperwork which will be entered into your electronic medical record.  These signatures attest to the fact that that the information above on your After Visit Summary has been reviewed and is understood.  Full responsibility of the confidentiality of this discharge information lies with you and/or your  care-partner.  Hiatal hernia information given.

## 2015-07-24 NOTE — Progress Notes (Signed)
Patient awakening,vss,report to rn 

## 2015-07-24 NOTE — Progress Notes (Signed)
Called to room to assist during endoscopic procedure.  Patient ID and intended procedure confirmed with present staff. Received instructions for my participation in the procedure from the performing physician.  

## 2015-07-24 NOTE — Op Note (Signed)
[facility]  [address] [place], [id number]   ENDOSCOPY PROCEDURE REPORT  PATIENT: [name], [name]  MR#: [id number] BIRTHDATE: [birth year] , 69  yrs. old GENDER: male ENDOSCOPIST: Gatha Mayer, MD, [facility] PROCEDURE DATE:  07/24/2015 PROCEDURE:  EGD w/ biopsy ASA CLASS:     Class II INDICATIONS:  f/u Barrett's. MEDICATIONS: Propofol 200 mg IV and Monitored anesthesia care TOPICAL ANESTHETIC: none  DESCRIPTION OF PROCEDURE: After the risks benefits and alternatives of the procedure were thoroughly explained, informed consent was obtained.  The LB [id number] endoscope was introduced through the mouth and advanced to the second portion of the duodenum , Without limitations.  The instrument was slowly withdrawn as the mucosa was fully examined.    1) Short-segement Barrett's changes again - 3 tongues - 41-42 cm - biopsies taken 2) 2-3 cm hiatal hernia 3) Multiple soft, fleshy subcentimeter polyps in body of stomach - biopsies taken - consistent with benign fundic gland polyps 4) Otherwise normal EGD.  Retroflexed views revealed a hiatal hernia.     The scope was then withdrawn from the patient and the procedure completed.  COMPLICATIONS: There were no immediate complications.  ENDOSCOPIC IMPRESSION: 1) Short-segement Barrett's changes again - 3 tongues - 41-42 cm - biopsies taken 2) 2-3 cm hiatal hernia 3) Multiple soft, fleshy subcentimeter polyps in body of stomach - biopsies taken - consistent with benign fundic gland polyps 4) Otherwise normal EGD  RECOMMENDATIONS: 1.  Await pathology results 2.  He probably does not meet current barrett's criteria as per 2016 guidelines - may not need further evaluation and may not need PPI (says he has not had chronic heartburn) - will notify and recommend after pathology review   eSigned:  Gatha Mayer, MD, [facility] 07/24/2015 2:07 PM    CC: The Patient

## 2015-07-25 ENCOUNTER — Telehealth: Payer: Self-pay

## 2015-07-25 NOTE — Telephone Encounter (Signed)
  Follow up Call-  Call back number 07/24/2015  Post procedure Call Back phone  # [phone]  Permission to leave phone message Yes     Patient questions:  Do you have a fever, pain , or abdominal swelling? No. Pain Score  0 *  Have you tolerated food without any problems? Yes.    Have you been able to return to your normal activities? Yes.    Do you have any questions about your discharge instructions: Diet   No. Medications  No. Follow up visit  No.  Do you have questions or concerns about your Care? No.  Actions: * If pain score is 4 or above: No action needed, pain <4.

## 2015-08-01 ENCOUNTER — Encounter: Payer: Self-pay | Admitting: Internal Medicine

## 2015-08-01 NOTE — Progress Notes (Signed)
Quick Note:  Fundic gland polyps Intestinal metaplasia - Barrett's  Recall office visit 5 years PPI optional based upon recent guideline update ______

## 2015-10-24 ENCOUNTER — Encounter: Payer: Self-pay | Admitting: Internal Medicine

## 2015-11-12 DIAGNOSIS — I889 Nonspecific lymphadenitis, unspecified: Secondary | ICD-10-CM | POA: Diagnosis not present

## 2015-11-12 DIAGNOSIS — Z6831 Body mass index (BMI) 31.0-31.9, adult: Secondary | ICD-10-CM | POA: Diagnosis not present

## 2016-02-13 DIAGNOSIS — Z23 Encounter for immunization: Secondary | ICD-10-CM | POA: Diagnosis not present

## 2016-03-20 ENCOUNTER — Encounter: Payer: Self-pay | Admitting: Internal Medicine

## 2016-03-20 ENCOUNTER — Ambulatory Visit (INDEPENDENT_AMBULATORY_CARE_PROVIDER_SITE_OTHER): Payer: Medicare Other | Admitting: Internal Medicine

## 2016-03-20 VITALS — BP 128/84 | HR 77 | Temp 97.7°F | Wt 226.8 lb

## 2016-03-20 DIAGNOSIS — N402 Nodular prostate without lower urinary tract symptoms: Secondary | ICD-10-CM | POA: Diagnosis not present

## 2016-03-20 DIAGNOSIS — Z0000 Encounter for general adult medical examination without abnormal findings: Secondary | ICD-10-CM

## 2016-03-20 DIAGNOSIS — Z09 Encounter for follow-up examination after completed treatment for conditions other than malignant neoplasm: Secondary | ICD-10-CM

## 2016-03-20 LAB — BASIC METABOLIC PANEL
BUN: 15 mg/dL (ref 6–23)
CALCIUM: 9.6 mg/dL (ref 8.4–10.5)
CO2: 27 meq/L (ref 19–32)
Chloride: 107 mEq/L (ref 96–112)
Creatinine, Ser: 0.8 mg/dL (ref 0.40–1.50)
GFR: 101.45 mL/min (ref >60.00)
GLUCOSE: 98 mg/dL (ref 70–99)
Potassium: 4.1 mEq/L (ref 3.5–5.1)
Sodium: 141 mEq/L (ref 135–145)

## 2016-03-20 LAB — LIPID PANEL
CHOL/HDL RATIO: 3
Cholesterol: 115 mg/dL (ref 0–200)
HDL: 36.3 mg/dL — ABNORMAL LOW (ref >39.00)
LDL CALC: 65 mg/dL (ref 0–99)
NonHDL: 79.06
Triglycerides: 72 mg/dL (ref 0.0–149.0)
VLDL: 14.4 mg/dL (ref 0.0–40.0)

## 2016-03-20 LAB — AST: AST: 17 U/L (ref 0–37)

## 2016-03-20 LAB — PSA: PSA: 1.13 ng/mL (ref 0.10–4.00)

## 2016-03-20 MED ORDER — ATORVASTATIN CALCIUM 40 MG PO TABS: 40.0000 mg | ORAL_TABLET | Freq: Every day | ORAL | 3 refills | Status: DC

## 2016-03-20 NOTE — Assessment & Plan Note (Addendum)
Td 08 ;Shingle shot 05/2010 ; pnm 23: 2012; prevnar: 2015; had a flu shot already CCS: had a Cscope 2001 and 02-2010, and next in 10 years  Prostate cancer screening: DRE stable , has a nodule. Check a PSA + FH CAD: On aspirin, , controlling CV RF Labs: BMP, AST, ALT, FLP, PSA Counseled about diet, exercise, healthcare power of attorney, fall prevention

## 2016-03-20 NOTE — Progress Notes (Signed)
Subjective:    Patient ID: [name], male    DOB: 1946-02-25, 70 y.o.   MRN: [id number]  DOS:  03/20/2016 Type of visit - description : Physical exam Interval history: HTN: Good medication compliance, ambulatory BPs in the 120/80 range. High cholesterol: Good compliance with Lipitor, needs a refill Barrett's esophagus: EGD report and biopsy reviewed.   Review of Systems Constitutional: No fever. No chills. No unexplained wt changes. No unusual sweats  HEENT: No dental problems, no ear discharge, no facial swelling, no voice changes. No eye discharge, no eye  redness , no  intolerance to light   Respiratory: No wheezing , no  difficulty breathing. No cough , no mucus production  Cardiovascular: No CP, no leg swelling , no  Palpitations  GI: no nausea, no vomiting, no diarrhea , no  abdominal pain.  No blood in the stools. No dysphagia, no odynophagia    Endocrine: No polyphagia, no polyuria , no polydipsia  GU: No dysuria, gross hematuria, difficulty urinating. No urinary urgency, no frequency.  Musculoskeletal: No joint swellings or unusual aches or pains  Skin: No change in the color of the skin, palor , no  Rash  Allergic, immunologic: No environmental allergies , no  food allergies  Neurological: No dizziness no  syncope. No headaches. No diplopia, no slurred, no slurred speech, no motor deficits, no facial  Numbness  Hematological: No enlarged lymph nodes, no easy bruising , no unusual bleedings  Psychiatry: No suicidal ideas, no hallucinations, no beavior problems, no confusion.  No unusual/severe anxiety, no depression   Past Medical History:  Diagnosis Date  . Barrett's esophagus   . GERD (gastroesophageal reflux disease)   . Hyperlipidemia   . HYPERTENSION, MILD 02/04/2008  . Kidney stones   . Prostate nodule     Past Surgical History:  Procedure Laterality Date  . APPENDECTOMY    . COLONOSCOPY    . ESOPHAGOGASTRODUODENOSCOPY      Social  History   Social History  . Marital status: Married    Spouse name: N/A  . Number of children: 1  . Years of education: N/A   Occupational History  . retired Copywriter, advertising   Social History Main Topics  . Smoking status: Former Research scientist (life sciences)  . Smokeless tobacco: Never Used     Comment: 40 yeras ago   . Alcohol use 0.0 oz/week     Comment: SOCIALLY  . Drug use: No  . Sexual activity: Not on file   Other Topics Concern  . 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 Heart disease Father 31    MI  . Heart disease Other     GRANDFATHER  . Cancer Maternal Aunt     PANCREATIC  . Pancreatic cancer Maternal Aunt   . Breast cancer Mother     dx age 64   . Hypertension Neg Hx   . Diabetes Neg Hx   . Colon cancer Neg Hx   . Prostate cancer Neg Hx   . Colon polyps Neg Hx   . Esophageal cancer Neg Hx   . Rectal cancer Neg Hx   . Stomach cancer Neg Hx        Medication List       Accurate as  of 03/20/16  4:45 PM. Always use your most recent med list.          amLODipine 5 MG tablet Commonly known as:  NORVASC Take 1 tablet (5 mg total) by mouth daily.   aspirin 81 MG tablet Take 81 mg by mouth daily.   atorvastatin 40 MG tablet Commonly known as:  LIPITOR Take 1 tablet (40 mg total) by mouth daily.   lansoprazole 30 MG capsule Commonly known as:  PREVACID Take 1 capsule by mouth as needed.   pantoprazole 40 MG tablet Commonly known as:  PROTONIX Take 1 tablet (40 mg total) by mouth daily.   telmisartan 40 MG tablet Commonly known as:  MICARDIS Take 1 tablet (40 mg total) by mouth daily.          Objective:   Physical Exam BP 128/84 (BP Location: Left Arm, Patient Position: Sitting, Cuff Size: Normal)   Pulse 77   Temp 97.7 F (36.5 C) (Oral)   Wt 226 lb 12.8 oz (102.9 kg)   SpO2 97%    BMI 31.63 kg/m   General:   Well developed, well nourished . NAD.  Neck: No  thyromegaly  HEENT:  Normocephalic . Face symmetric, atraumatic Lungs:  CTA B Normal respiratory effort, no intercostal retractions, no accessory muscle use. Heart: RRR,  no murmur.  No pretibial edema bilaterally  Abdomen:  Not distended, soft, non-tender. No rebound or rigidity.   Skin: Exposed areas without rash. Not pale. Not jaundice Rectal:  External abnormalities: none. Normal sphincter tone. No rectal masses or tenderness.  No stools found Prostate: Exam is unchanged from previous years, he has a right-sided nodule approximately 2-3 mm c in size. Nontender. Neurologic:  alert & oriented X3.  Speech normal, gait appropriate for age and unassisted Strength symmetric and appropriate for age.  Psych: Cognition and judgment appear intact.  Cooperative with normal attention span and concentration.  Behavior appropriate. No anxious or depressed appearing.    Assessment & Plan:   Assessment  HTN Hyperlipidemia Barrett's esophagus -- last EGD 09-2015, + Barrett's, recheck 5 years, PPIs optional per guidelines R Prostate nodule, saw urology ~ 2014 ,small Ca++ area, no Bx H/o urolithiasis Insomnia Back pain: On and off, saw Dr Nelva Bush ~08-2014, MRI done, rx conservative treatment +FH CAD father age 31   PLAN: HTN:  Continue amlodipine, Micardis. Checking labs High cholesterol: On Lipitor, check labs Barrett's esophagus: Last EGD 09-2015, next in 5 years, per new guidelines PPIs optional. RTC one year

## 2016-03-20 NOTE — Assessment & Plan Note (Signed)
HTN:  Continue amlodipine, Micardis. Checking labs High cholesterol: On Lipitor, check labs Barrett's esophagus: Last EGD 09-2015, next in 5 years, per new guidelines PPIs optional. RTC one year

## 2016-03-20 NOTE — Progress Notes (Signed)
Pre visit review using our clinic review tool, if applicable. No additional management support is needed unless otherwise documented below in the visit note. 

## 2016-03-20 NOTE — Patient Instructions (Signed)
Get your blood work before you leave   Next visit one year  Fall Prevention and [place] cause injuries and can affect all age groups. It is possible to use preventive measures to significantly decrease the likelihood of falls. There are many simple measures which can make your home safer and prevent falls. OUTDOORS  Repair cracks and edges of walkways and driveways.  Remove high doorway thresholds.  Trim shrubbery on the main path into your home.  Have good outside lighting.  Clear walkways of tools, rocks, debris, and clutter.  Check that handrails are not broken and are securely fastened. Both sides of steps should have handrails.  Have leaves, snow, and ice cleared regularly.  Use sand or salt on walkways during winter months.  In the garage, clean up grease or oil spills. BATHROOM  Install night lights.  Install grab bars by the toilet and in the tub and shower.  Use non-skid mats or decals in the tub or shower.  Place a plastic non-slip stool in the shower to sit on, if needed.  Keep floors dry and clean up all water on the floor immediately.  Remove soap buildup in the tub or shower on a regular basis.  Secure bath mats with non-slip, double-sided rug tape.  Remove throw rugs and tripping hazards from the floors. BEDROOMS  Install night lights.  Make sure a bedside light is easy to reach.  Do not use oversized bedding.  Keep a telephone by your bedside.  Have a firm chair with side arms to use for getting dressed.  Remove throw rugs and tripping hazards from the floor. KITCHEN  Keep handles on pots and pans turned toward the center of the stove. Use back burners when possible.  Clean up spills quickly and allow time for drying.  Avoid walking on wet floors.  Avoid hot utensils and knives.  Position shelves so they are not too high or low.  Place commonly used objects within easy reach.  If necessary, use a sturdy step stool with a  grab bar when reaching.  Keep electrical cables out of the way.  Do not use floor polish or wax that makes floors slippery. If you must use wax, use non-skid floor wax.  Remove throw rugs and tripping hazards from the floor. STAIRWAYS  Never leave objects on stairs.  Place handrails on both sides of stairways and use them. Fix any loose handrails. Make sure handrails on both sides of the stairways are as long as the stairs.  Check carpeting to make sure it is firmly attached along stairs. Make repairs to worn or loose carpet promptly.  Avoid placing throw rugs at the top or bottom of stairways, or properly secure the rug with carpet tape to prevent slippage. Get rid of throw rugs, if possible.  Have an electrician put in a light switch at the top and bottom of the stairs. OTHER FALL PREVENTION TIPS  Wear low-heel or rubber-soled shoes that are supportive and fit well. Wear closed toe shoes.  When using a stepladder, make sure it is fully opened and both spreaders are firmly locked. Do not climb a closed stepladder.  Add color or contrast paint or tape to grab bars and handrails in your home. Place contrasting color strips on first and last steps.  Learn and use mobility aids as needed. Install an electrical emergency response system.  Turn on lights to avoid dark areas. Replace light bulbs that burn out immediately. Get light switches that  glow.  Arrange furniture to create clear pathways. Keep furniture in the same place.  Firmly attach carpet with non-skid or double-sided tape.  Eliminate uneven floor surfaces.  Select a carpet pattern that does not visually hide the edge of steps.  Be aware of all pets. OTHER HOME SAFETY TIPS  Set the water temperature for 120 F (48.8 C).  Keep emergency numbers on or near the telephone.  Keep smoke detectors on every level of the home and near sleeping areas. Document Released: 05/16/2002 Document Revised: 11/25/2011 Document  Reviewed: 08/15/2011 [facility] Patient Information 2015 [place], [place]. This information is not intended to replace advice given to you by your health care provider. Make sure you discuss any questions you have with your health care provider.   Preventive Care for Adults Ages 43 and over  Blood pressure check.** / Every 1 to 2 years.  Lipid and cholesterol check.**/ Every 5 years beginning at age 36.  Lung cancer screening. / Every year if you are aged 49-80 years and have a 30-pack-year history of smoking and currently smoke or have quit within the past 15 years. Yearly screening is stopped once you have quit smoking for at least 15 years or develop a health problem that would prevent you from having lung cancer treatment.  Fecal occult blood test (FOBT) of stool. / Every year beginning at age 32 and continuing until age 43. You may not have to do this test if you get a colonoscopy every 10 years.  Flexible sigmoidoscopy** or colonoscopy.** / Every 5 years for a flexible sigmoidoscopy or every 10 years for a colonoscopy beginning at age 60 and continuing until age 49.  Hepatitis C blood test.** / For all people born from [birth year] through 1965 and any individual with known risks for hepatitis C.  Abdominal aortic aneurysm (AAA) screening.** / A one-time screening for ages 25 to 48 years who are current or former smokers.  Skin self-exam. / Monthly.  Influenza vaccine. / Every year.  Tetanus, diphtheria, and acellular pertussis (Tdap/Td) vaccine.** / 1 dose of Td every 10 years.  Varicella vaccine.** / Consult your health care provider.  Zoster vaccine.** / 1 dose for adults aged 58 years or older.  Pneumococcal 13-valent conjugate (PCV13) vaccine.** / Consult your health care provider.  Pneumococcal polysaccharide (PPSV23) vaccine.** / 1 dose for all adults aged 28 years and older.  Meningococcal vaccine.** / Consult your health care provider.  Hepatitis A vaccine.** / Consult your  health care provider.  Hepatitis B vaccine.** / Consult your health care provider.  Haemophilus influenzae type b (Hib) vaccine.** / Consult your health care provider. **Family history and personal history of risk and conditions may change your health care provider's recommendations. Document Released: 07/22/2001 Document Revised: 05/31/2013 Document Reviewed: 10/21/2010 [facility] Patient Information 2015 [place], [place]. This information is not intended to replace advice given to you by your health care provider. Make sure you discuss any questions you have with your health care provider.

## 2016-04-22 ENCOUNTER — Other Ambulatory Visit: Payer: Self-pay | Admitting: Internal Medicine

## 2016-04-24 DIAGNOSIS — H25813 Combined forms of age-related cataract, bilateral: Secondary | ICD-10-CM | POA: Diagnosis not present

## 2016-06-10 ENCOUNTER — Encounter: Payer: Self-pay | Admitting: Internal Medicine

## 2016-06-10 ENCOUNTER — Telehealth: Payer: Self-pay

## 2016-06-10 ENCOUNTER — Other Ambulatory Visit: Payer: Self-pay | Admitting: Internal Medicine

## 2016-06-10 DIAGNOSIS — I10 Essential (primary) hypertension: Secondary | ICD-10-CM

## 2016-06-10 MED ORDER — LOSARTAN POTASSIUM 50 MG PO TABS: 50.0000 mg | ORAL_TABLET | Freq: Every day | ORAL | 3 refills | Status: DC

## 2016-06-10 NOTE — Telephone Encounter (Signed)
[employer] called to inform PCP of denial on this medication because patient has not tried either alternative medication. Alternatives are Irbesartan, Losartan or Valsortan. Plse call [phone] if any questions

## 2016-06-10 NOTE — Telephone Encounter (Signed)
Telmisartan d/c. Losartan 50mg  sent to [place] [place]. MyChart message sent to inform Pt of medication change. Informed him to keep close eye of BP's and to call office to schedule lab appt to be completed in 6 weeks. Informed to let us know if questions/concerns.

## 2016-06-10 NOTE — Telephone Encounter (Signed)
Please advise 

## 2016-06-10 NOTE — Telephone Encounter (Signed)
Please advice patient, I'm okay to try losartan 50 mg one tablet daily, send a prescription. Once he starts taking it, needs to keep an eye on his blood pressure to be sure the new med is working. Also, arrange a BMP 6 weeks from now.

## 2016-06-10 NOTE — Telephone Encounter (Signed)
Received PA denial notification letter. Letter sent for scanning.  

## 2016-06-10 NOTE — Telephone Encounter (Signed)
PA initiated via Covermymeds; KEY: [id number]. Awaiting determination.

## 2016-07-03 ENCOUNTER — Other Ambulatory Visit: Payer: Self-pay | Admitting: Internal Medicine

## 2016-07-31 ENCOUNTER — Encounter: Payer: Self-pay | Admitting: Internal Medicine

## 2016-08-01 ENCOUNTER — Other Ambulatory Visit (INDEPENDENT_AMBULATORY_CARE_PROVIDER_SITE_OTHER): Payer: Medicare Other

## 2016-08-01 DIAGNOSIS — I10 Essential (primary) hypertension: Secondary | ICD-10-CM | POA: Diagnosis not present

## 2016-08-01 LAB — BASIC METABOLIC PANEL
BUN: 13 mg/dL (ref 6–23)
CO2: 26 mEq/L (ref 19–32)
Calcium: 9.4 mg/dL (ref 8.4–10.5)
Chloride: 109 mEq/L (ref 96–112)
Creatinine, Ser: 0.8 mg/dL (ref 0.40–1.50)
GFR: 101.35 mL/min (ref >60.00)
Glucose, Bld: 107 mg/dL — ABNORMAL HIGH (ref 70–99)
Potassium: 4.1 mEq/L (ref 3.5–5.1)
Sodium: 140 mEq/L (ref 135–145)

## 2016-08-04 ENCOUNTER — Encounter: Payer: Self-pay | Admitting: Internal Medicine

## 2016-08-04 MED ORDER — LOSARTAN POTASSIUM 50 MG PO TABS: 50.0000 mg | ORAL_TABLET | Freq: Every day | ORAL | 1 refills | Status: DC

## 2016-09-12 ENCOUNTER — Ambulatory Visit (INDEPENDENT_AMBULATORY_CARE_PROVIDER_SITE_OTHER): Payer: Medicare Other

## 2016-09-12 ENCOUNTER — Encounter: Payer: Self-pay | Admitting: Sports Medicine

## 2016-09-12 ENCOUNTER — Ambulatory Visit (INDEPENDENT_AMBULATORY_CARE_PROVIDER_SITE_OTHER): Payer: Medicare Other | Admitting: Sports Medicine

## 2016-09-12 DIAGNOSIS — M7741 Metatarsalgia, right foot: Secondary | ICD-10-CM

## 2016-09-12 DIAGNOSIS — M2042 Other hammer toe(s) (acquired), left foot: Secondary | ICD-10-CM

## 2016-09-12 DIAGNOSIS — M779 Enthesopathy, unspecified: Secondary | ICD-10-CM

## 2016-09-12 DIAGNOSIS — R52 Pain, unspecified: Secondary | ICD-10-CM

## 2016-09-12 DIAGNOSIS — M7742 Metatarsalgia, left foot: Secondary | ICD-10-CM

## 2016-09-12 DIAGNOSIS — M2041 Other hammer toe(s) (acquired), right foot: Secondary | ICD-10-CM | POA: Diagnosis not present

## 2016-09-12 NOTE — Progress Notes (Signed)
   Subjective:    Patient ID: [name], male    DOB: 01-14-1946, 71 y.o.   MRN: [id number]  HPI   I have some pain in the balls of both feet and feels like a knot but I can not feel it and burns and throbs some and sore and tender and I do have the inserts and left is worse    Review of Systems  All other systems reviewed and are negative.      Objective:   Physical Exam        Assessment & Plan:

## 2016-09-13 MED ORDER — TRIAMCINOLONE ACETONIDE 10 MG/ML IJ SUSP: 10.0000 mg | Freq: Once | INTRAMUSCULAR | Status: DC

## 2016-09-13 NOTE — Progress Notes (Signed)
Subjective: [name] is a 71 y.o. male patient who presents to office for evaluation of L>R foot pain. Patient complains of progressive pain especially over the last 6 months in the L>R foot at the ball especially after a game of golf. Patient has previous custom orthotics with no relief. Patient denies any other pedal complaints. Denies injury/trip/fall/sprain/any causative factors.   Patient Active Problem List   Diagnosis Date Noted  . PCP NOTES >>> 03/15/2015  . Prostate nodule 03/14/2014  . Annual physical exam 03/07/2011  . INSOMNIA-SLEEP DISORDER-UNSPEC 06/19/2008  . HYPERTENSION, MILD 02/04/2008  . HYPERGLYCEMIA, BORDERLINE 02/04/2008  . NEPHROLITHIASIS 10/11/2007  . Hyperlipidemia 02/19/2007  . BARRETT'S ESOPHAGUS 10/23/2006    Current Outpatient Prescriptions on File Prior to Visit  Medication Sig Dispense Refill  . amLODipine (NORVASC) 5 MG tablet Take 1 tablet (5 mg total) by mouth daily. 90 tablet 3  . aspirin 81 MG tablet Take 81 mg by mouth daily.      Marland Kitchen atorvastatin (LIPITOR) 40 MG tablet Take 1 tablet (40 mg total) by mouth daily. 90 tablet 3  . lansoprazole (PREVACID) 30 MG capsule Take 1 capsule by mouth as needed.    Marland Kitchen losartan (COZAAR) 50 MG tablet Take 1 tablet (50 mg total) by mouth daily. 90 tablet 1  . pantoprazole (PROTONIX) 40 MG tablet Take 1 tablet (40 mg total) by mouth daily. 90 tablet 2   No current facility-administered medications on file prior to visit.     No Known Allergies  Objective:  General: Alert and oriented x3 in no acute distress  Dermatology: No open lesions bilateral lower extremities, no webspace macerations, no ecchymosis bilateral, all nails x 10 are well manicured.  Vascular: Dorsalis Pedis and Posterior Tibial pedal pulses palpable, Capillary Fill Time 3 seconds,(+) pedal hair growth bilateral, no edema bilateral lower extremities, Temperature gradient within normal limits.  Neurology: Johney [place] sensation intact via light  touch bilateral, Protective sensation intact with Thornell Mule Monofilament to all pedal sites, Position sense intact, vibratory intact bilateral, Deep tendon reflexes within normal limits bilateral, No babinski sign present bilateral. (- )Tinels sign bilateral.   Musculoskeletal: Mild tenderness with palpation at sub met 2 on left>right foot,No pain with calf compression bilateral. There is decreased ankle rom with knee extending  vs flexed resembling gastroc equnius bilateral, Subtalar joint range of motion is within normal limits, there is no 1st ray hypermobility noted bilateral, decreased 1st MPJ rom Right<Left with functional limitus noted on weightbearing exam. Mild hammertoe. Strength within normal limits in all groups bilateral.   Gait: Mildly Antalgic gait  Xrays  Left and right foot   Impression: Mild hammertoe, inferior spur, no other acute findings.   Assessment and Plan: Problem List Items Addressed This Visit    None    Visit Diagnoses    Capsulitis    -  Primary   Relevant Medications   triamcinolone acetonide (KENALOG) 10 MG/ML injection 10 mg   Pain       L>R foot   Relevant Orders   DG Foot Complete Right   DG Foot Complete Left   Hammer toes of both feet       Metatarsalgia of both feet           -Complete examination performed -Xrays reviewed -Discussed treatement options for capsulitis and hammertoe  -After oral consent and aseptic prep, injected a mixture containing 1 ml of 2%  plain lidocaine, 1 ml 0.5% plain marcaine, 0.5 ml of kenalog 10  and 0.5 ml of dexamethasone phosphate into left sub met 2 without complication. Post-injection care discussed with patient.  -Applied removal metatarsal pads and advised patient if works well patient will benefit from his current orthotics to be modified  -Patient to return to office 4 weeks or sooner if condition worsens.  Landis Martins, DPM

## 2016-10-10 ENCOUNTER — Ambulatory Visit: Payer: Medicare Other | Admitting: Sports Medicine

## 2016-12-18 ENCOUNTER — Other Ambulatory Visit: Payer: Self-pay | Admitting: Internal Medicine

## 2017-01-02 ENCOUNTER — Other Ambulatory Visit: Payer: Self-pay | Admitting: Internal Medicine

## 2017-03-23 ENCOUNTER — Encounter: Payer: Self-pay | Admitting: Internal Medicine

## 2017-03-23 ENCOUNTER — Ambulatory Visit (INDEPENDENT_AMBULATORY_CARE_PROVIDER_SITE_OTHER): Payer: Medicare Other | Admitting: Internal Medicine

## 2017-03-23 VITALS — BP 124/68 | HR 60 | Temp 97.5°F | Resp 14 | Ht 71.0 in | Wt 207.1 lb

## 2017-03-23 DIAGNOSIS — E785 Hyperlipidemia, unspecified: Secondary | ICD-10-CM

## 2017-03-23 DIAGNOSIS — I10 Essential (primary) hypertension: Secondary | ICD-10-CM

## 2017-03-23 DIAGNOSIS — K2270 Barrett's esophagus without dysplasia: Secondary | ICD-10-CM

## 2017-03-23 DIAGNOSIS — Z0000 Encounter for general adult medical examination without abnormal findings: Secondary | ICD-10-CM | POA: Diagnosis not present

## 2017-03-23 DIAGNOSIS — N402 Nodular prostate without lower urinary tract symptoms: Secondary | ICD-10-CM

## 2017-03-23 DIAGNOSIS — Z23 Encounter for immunization: Secondary | ICD-10-CM

## 2017-03-23 DIAGNOSIS — Z1159 Encounter for screening for other viral diseases: Secondary | ICD-10-CM | POA: Diagnosis not present

## 2017-03-23 LAB — COMPREHENSIVE METABOLIC PANEL
ALBUMIN: 4.6 g/dL (ref 3.5–5.2)
ALT: 24 U/L (ref 0–53)
AST: 17 U/L (ref 0–37)
Alkaline Phosphatase: 71 U/L (ref 39–117)
BUN: 17 mg/dL (ref 6–23)
CHLORIDE: 106 meq/L (ref 96–112)
CO2: 26 mEq/L (ref 19–32)
CREATININE: 0.81 mg/dL (ref 0.40–1.50)
Calcium: 9.9 mg/dL (ref 8.4–10.5)
GFR: 99.72 mL/min (ref >60.00)
GLUCOSE: 99 mg/dL (ref 70–99)
POTASSIUM: 3.9 meq/L (ref 3.5–5.1)
SODIUM: 142 meq/L (ref 135–145)
TOTAL PROTEIN: 7 g/dL (ref 6.0–8.3)
Total Bilirubin: 0.6 mg/dL (ref 0.2–1.2)

## 2017-03-23 LAB — CBC WITH DIFFERENTIAL/PLATELET
BASOS ABS: 0 10*3/uL (ref 0.0–0.1)
Basophils Relative: 0.8 % (ref 0.0–3.0)
EOS ABS: 0.1 10*3/uL (ref 0.0–0.7)
Eosinophils Relative: 2.3 % (ref 0.0–5.0)
HEMATOCRIT: 45.1 % (ref 39.0–52.0)
HEMOGLOBIN: 15.3 g/dL (ref 13.0–17.0)
LYMPHS PCT: 22.9 % (ref 12.0–46.0)
Lymphs Abs: 1.1 10*3/uL (ref 0.7–4.0)
MCHC: 33.9 g/dL (ref 30.0–36.0)
MCV: 93.8 fl (ref 78.0–100.0)
MONO ABS: 0.4 10*3/uL (ref 0.1–1.0)
Monocytes Relative: 7.9 % (ref 3.0–12.0)
Neutro Abs: 3 10*3/uL (ref 1.4–7.7)
Neutrophils Relative %: 66.1 % (ref 43.0–77.0)
PLATELETS: 220 10*3/uL (ref 150.0–400.0)
RBC: 4.81 Mil/uL (ref 4.22–5.81)
RDW: 13.9 % (ref 11.5–15.5)
WBC: 4.6 10*3/uL (ref 4.0–10.5)

## 2017-03-23 LAB — LIPID PANEL
Cholesterol: 114 mg/dL (ref 0–200)
HDL: 38.4 mg/dL — ABNORMAL LOW (ref >39.00)
LDL CALC: 61 mg/dL (ref 0–99)
NONHDL: 75.18
Total CHOL/HDL Ratio: 3
Triglycerides: 72 mg/dL (ref 0.0–149.0)
VLDL: 14.4 mg/dL (ref 0.0–40.0)

## 2017-03-23 LAB — PSA: PSA: 0.98 ng/mL (ref 0.10–4.00)

## 2017-03-23 LAB — HEMOGLOBIN A1C: HEMOGLOBIN A1C: 5.5 % (ref 4.6–6.5)

## 2017-03-23 NOTE — Assessment & Plan Note (Signed)
HTN: Currently on amlodipine, losartan. Well controlled, amb Bps  120/80. Hyperlipidemia: Continue Lipitor Barrett's esophagus: Asx, not taking PPIs, denies heartburn, dysphagia. Right prostate nodule: Stable per exam. Check a PSA RTC one year

## 2017-03-23 NOTE — Progress Notes (Signed)
Pre visit review using our clinic review tool, if applicable. No additional management support is needed unless otherwise documented below in the visit note. 

## 2017-03-23 NOTE — Progress Notes (Signed)
Subjective:    Patient ID: [name], male    DOB: [birth year], 71 y.o.   MRN: [id number]  DOS:  03/23/2017 Type of visit - description : cpx Interval history: Concerns Since last year he has definitely improved his diet, he remains active taking walks and playing golf. Has lost several pounds.  Wt Readings from Last 3 Encounters:  03/23/17 207 lb 2 oz (94 kg)  03/20/16 226 lb 12.8 oz (102.9 kg)  07/24/15 225 lb (102.1 kg)   Review of Systems  A 14 point review of systems is negative    Past Medical History:  Diagnosis Date  . Barrett's esophagus   . GERD (gastroesophageal reflux disease)   . Hyperlipidemia   . HYPERTENSION, MILD 02/04/2008  . Kidney stones   . Prostate nodule     Past Surgical History:  Procedure Laterality Date  . APPENDECTOMY    . COLONOSCOPY    . ESOPHAGOGASTRODUODENOSCOPY      Social History   Social History  . Marital status: Married    Spouse name: N/A  . Number of children: 1  . Years of education: N/A   Occupational History  . retired Copywriter, advertising   Social History Main Topics  . Smoking status: Former Research scientist (life sciences)  . Smokeless tobacco: Never Used     Comment: 40 yeras ago   . Alcohol use 0.0 oz/week     Comment: SOCIALLY  . Drug use: No  . Sexual activity: Not on file   Other Topics Concern  . 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 Heart disease Father 69       MI  . Heart disease Other        GRANDFATHER  . Cancer Maternal Aunt        PANCREATIC  . Pancreatic cancer Maternal Aunt   . Breast cancer Mother        dx age 12   . Hypertension Neg Hx   . Diabetes Neg Hx   . Colon cancer Neg Hx   . Prostate cancer Neg Hx   . Colon polyps Neg Hx   . Esophageal cancer Neg Hx   . Rectal cancer Neg Hx   . Stomach cancer Neg Hx      Allergies as of  03/23/2017   No Known Allergies     Medication List       Accurate as of 03/23/17 12:58 PM. Always use your most recent med list.          amLODipine 5 MG tablet Commonly known as:  NORVASC Take 1 tablet (5 mg total) by mouth daily.   aspirin 81 MG tablet Take 81 mg by mouth daily.   atorvastatin 40 MG tablet Commonly known as:  LIPITOR Take 1 tablet (40 mg total) by mouth daily.   losartan 50 MG tablet Commonly known as:  COZAAR Take 1 tablet (50 mg total) by mouth daily.          Objective:   Physical Exam BP 124/68 (BP Location: Left Arm, Patient Position: Sitting, Cuff Size: Normal)   Pulse 60   Temp (!) 97.5 F (36.4 C) (Oral)   Resp 14   Ht 5\' 11"  (1.803 m)   Wt 207 lb 2  oz (94 kg)   SpO2 97%   BMI 28.89 kg/m   General:   Well developed, well nourished . NAD.  Neck: No  thyromegaly  HEENT:  Normocephalic . Face symmetric, atraumatic Lungs:  CTA B Normal respiratory effort, no intercostal retractions, no accessory muscle use. Heart: RRR,  no murmur.  No pretibial edema bilaterally  Abdomen:  Not distended, soft, non-tender. No rebound or rigidity.   Skin: Exposed areas without rash. Not pale. Not jaundice Rectal:  External abnormalities: none. Normal sphincter tone. No rectal masses or tenderness.  No stools found Prostate:   has a right-sided nodule approximately 2-3 mm c in size. Nontender. Unchanged. Neurologic:  alert & oriented X3.  Speech normal, gait appropriate for age and unassisted Strength symmetric and appropriate for age.  Psych: Cognition and judgment appear intact.  Cooperative with normal attention span and concentration.  Behavior appropriate. No anxious or depressed appearing.    Assessment & Plan:   Assessment  HTN Hyperlipidemia Barrett's esophagus -- last EGD 09-2015, + Barrett's, recheck 5 years, PPIs optional per guidelines R Prostate nodule, saw urology ~ 2014 ,small Ca++ area, no Bx H/o  urolithiasis Insomnia Back pain: On and off, saw Dr Nelva Bush ~08-2014, MRI done, rx conservative treatment +FH CAD father age 9   PLAN:  HTN: Currently on amlodipine, losartan. Well controlled, amb Bps  120/80. Hyperlipidemia: Continue Lipitor Barrett's esophagus: Asx, not taking PPIs, denies heartburn, dysphagia. Right prostate nodule: Stable per exam. Check a PSA RTC one year

## 2017-03-23 NOTE — Assessment & Plan Note (Addendum)
-  Td 03-2017 ;Shingle shot 05/2010 ; pnm 23: 2012; prevnar: 2015; shingrix discussed;  flu shot already -CCS: had a Cscope 2001 and 02-2010, and next in 10 years  Prostate cancer screening: DRE stable , has a nodule. Check a PSA + FH CAD: On aspirin, , controlling CV RF Labs: CMP, FLP, CBC, A1c, PSA, hep C Counseled about diet, exercise. To have a Medicare wellness at his convenience

## 2017-03-23 NOTE — Patient Instructions (Signed)
GO TO THE LAB : Get the blood work     GO TO THE FRONT DESK Schedule your next appointment for a  physical exam in one year  Consider a Medicare wellness at your convenience    Check the  blood pressure 2   times a month   Be sure your blood pressure is between 110/65 and  135/85. If it is consistently higher or lower, let me know  Okay to get [facility]

## 2017-03-24 LAB — HEPATITIS C ANTIBODY
Hepatitis C Ab: NONREACTIVE
SIGNAL TO CUT-OFF: 0.02 (ref <1.00)

## 2017-04-22 ENCOUNTER — Encounter: Payer: Self-pay | Admitting: Internal Medicine

## 2017-04-23 DIAGNOSIS — H25813 Combined forms of age-related cataract, bilateral: Secondary | ICD-10-CM | POA: Diagnosis not present

## 2017-05-10 ENCOUNTER — Encounter: Payer: Self-pay | Admitting: Internal Medicine

## 2017-05-13 ENCOUNTER — Other Ambulatory Visit: Payer: Self-pay | Admitting: Internal Medicine

## 2017-06-17 ENCOUNTER — Other Ambulatory Visit: Payer: Self-pay | Admitting: Internal Medicine

## 2017-08-19 ENCOUNTER — Other Ambulatory Visit: Payer: Self-pay | Admitting: Internal Medicine

## 2017-08-19 NOTE — Telephone Encounter (Signed)
Pantoprazole 40mg  not on med list- discussed w/ PCP- Pt has Barrett's esophagus- okay to restart. Rx sent.

## 2017-08-20 ENCOUNTER — Telehealth: Payer: Self-pay | Admitting: *Deleted

## 2017-08-20 NOTE — Telephone Encounter (Signed)
Received Health Care POA and Living Will for patient via mail; forwarded to provider, then will have scanned in pt's chart/SLS 03/14

## 2017-10-17 ENCOUNTER — Other Ambulatory Visit: Payer: Self-pay | Admitting: Internal Medicine

## 2018-03-03 ENCOUNTER — Other Ambulatory Visit: Payer: Self-pay | Admitting: Internal Medicine

## 2018-03-12 ENCOUNTER — Telehealth: Payer: Self-pay

## 2018-03-12 NOTE — Telephone Encounter (Signed)
Tried calling Pt's wife, no answer, unable to leave message.  ?

## 2018-03-12 NOTE — Telephone Encounter (Signed)
Copied from [place] [id number]. Topic: Inquiry >> Mar 12, 2018 11:16 AM Rutherford Nail, NT wrote: Reason for CRM: Patient's wife calling and states that she would like to know if Dr Larose Kells could do a memory and a hearing test at his visit on 03/29/18. Please advise.

## 2018-03-12 NOTE — Telephone Encounter (Signed)
I can check his memory, if he is having hearing problems, recommend to see an audiologist such as [facility]

## 2018-03-29 ENCOUNTER — Ambulatory Visit (INDEPENDENT_AMBULATORY_CARE_PROVIDER_SITE_OTHER): Payer: Medicare Other | Admitting: Internal Medicine

## 2018-03-29 ENCOUNTER — Encounter: Payer: Self-pay | Admitting: Internal Medicine

## 2018-03-29 VITALS — BP 116/72 | HR 62 | Temp 97.8°F | Resp 16 | Ht 71.0 in | Wt 205.5 lb

## 2018-03-29 DIAGNOSIS — E785 Hyperlipidemia, unspecified: Secondary | ICD-10-CM

## 2018-03-29 DIAGNOSIS — Z23 Encounter for immunization: Secondary | ICD-10-CM

## 2018-03-29 DIAGNOSIS — Z125 Encounter for screening for malignant neoplasm of prostate: Secondary | ICD-10-CM

## 2018-03-29 DIAGNOSIS — Z0000 Encounter for general adult medical examination without abnormal findings: Secondary | ICD-10-CM

## 2018-03-29 LAB — COMPREHENSIVE METABOLIC PANEL
ALBUMIN: 4.7 g/dL (ref 3.5–5.2)
ALK PHOS: 64 U/L (ref 39–117)
ALT: 22 U/L (ref 0–53)
AST: 15 U/L (ref 0–37)
BUN: 21 mg/dL (ref 6–23)
CALCIUM: 9.6 mg/dL (ref 8.4–10.5)
CO2: 26 mEq/L (ref 19–32)
CREATININE: 0.85 mg/dL (ref 0.40–1.50)
Chloride: 107 mEq/L (ref 96–112)
GFR: 94.06 mL/min (ref >60.00)
Glucose, Bld: 104 mg/dL — ABNORMAL HIGH (ref 70–99)
Potassium: 4.2 mEq/L (ref 3.5–5.1)
Sodium: 141 mEq/L (ref 135–145)
TOTAL PROTEIN: 7 g/dL (ref 6.0–8.3)
Total Bilirubin: 0.6 mg/dL (ref 0.2–1.2)

## 2018-03-29 LAB — CBC WITH DIFFERENTIAL/PLATELET
BASOS PCT: 0.8 % (ref 0.0–3.0)
Basophils Absolute: 0 10*3/uL (ref 0.0–0.1)
EOS ABS: 0.1 10*3/uL (ref 0.0–0.7)
EOS PCT: 2.3 % (ref 0.0–5.0)
HCT: 46.4 % (ref 39.0–52.0)
Hemoglobin: 15.9 g/dL (ref 13.0–17.0)
LYMPHS PCT: 26.5 % (ref 12.0–46.0)
Lymphs Abs: 1.2 10*3/uL (ref 0.7–4.0)
MCHC: 34.3 g/dL (ref 30.0–36.0)
MCV: 91.8 fl (ref 78.0–100.0)
MONOS PCT: 10.1 % (ref 3.0–12.0)
Monocytes Absolute: 0.5 10*3/uL (ref 0.1–1.0)
NEUTROS ABS: 2.7 10*3/uL (ref 1.4–7.7)
Neutrophils Relative %: 60.3 % (ref 43.0–77.0)
Platelets: 215 10*3/uL (ref 150.0–400.0)
RBC: 5.06 Mil/uL (ref 4.22–5.81)
RDW: 13.8 % (ref 11.5–15.5)
WBC: 4.6 10*3/uL (ref 4.0–10.5)

## 2018-03-29 LAB — LIPID PANEL
CHOLESTEROL: 127 mg/dL (ref 0–200)
HDL: 37.2 mg/dL — ABNORMAL LOW (ref >39.00)
LDL CALC: 76 mg/dL (ref 0–99)
NonHDL: 90.14
TRIGLYCERIDES: 70 mg/dL (ref 0.0–149.0)
Total CHOL/HDL Ratio: 3
VLDL: 14 mg/dL (ref 0.0–40.0)

## 2018-03-29 LAB — TSH: TSH: 1.25 u[IU]/mL (ref 0.35–4.50)

## 2018-03-29 LAB — PSA: PSA: 0.8 ng/mL (ref 0.10–4.00)

## 2018-03-29 MED ORDER — ZOSTER VAC RECOMB ADJUVANTED 50 MCG/0.5ML IM SUSR: 0.5000 mL | Freq: Once | INTRAMUSCULAR | 1 refills | Status: AC

## 2018-03-29 NOTE — Patient Instructions (Signed)
GO TO THE LAB : Get the blood work     GO TO THE FRONT DESK Schedule your next appointment for a physical exam in 1 year  Consider Medicare wellness exam with one of our nurses   Check the  blood pressure 2   times a month  Be sure your blood pressure is between 110/65 and  135/85. If it is consistently higher or lower, let me know

## 2018-03-29 NOTE — Progress Notes (Signed)
Subjective:    Patient ID: [name], male    DOB: 01-Nov-1945, 72 y.o.   MRN: [id number]  DOS:  03/29/2018 Type of visit - description : cpx Interval history: No concerns, good compliance with medications.  Review of Systems  A 14 point review of systems is negative    Past Medical History:  Diagnosis Date  . Barrett's esophagus   . GERD (gastroesophageal reflux disease)   . Hyperlipidemia   . HYPERTENSION, MILD 02/04/2008  . Kidney stones   . Prostate nodule     Past Surgical History:  Procedure Laterality Date  . APPENDECTOMY    . COLONOSCOPY    . ESOPHAGOGASTRODUODENOSCOPY      Social History   Socioeconomic History  . Marital status: Married    Spouse name: Not on file  . Number of children: 1  . Years of education: Not on file  . Highest education level: Not on file  Occupational History  . Occupation: retired Administrator, Civil Service: [employer]  . Financial resource strain: Not on file  . Food insecurity:    Worry: Not on file    Inability: Not on file  . Transportation needs:    Medical: Not on file    Non-medical: Not on file  Tobacco Use  . Smoking status: Former Research scientist (life sciences)  . Smokeless tobacco: Never Used  . Tobacco comment: 40 yeras ago   Substance and Sexual Activity  . Alcohol use: Yes    Alcohol/week: 0.0 standard drinks    Comment: SOCIALLY  . Drug use: No  . Sexual activity: Not on file  Lifestyle  . Physical activity:    Days per week: Not on file    Minutes per session: Not on file  . Stress: Not on file  Relationships  . Social connections:    Talks on phone: Not on file    Gets together: Not on file    Attends religious service: Not on file    Active member of club or organization: Not on file    Attends meetings of clubs or organizations: Not on file    Relationship status: Not on file  . Intimate partner violence:    Fear of current or ex partner: Not on file    Emotionally abused: Not on file    Physically abused: Not on file    Forced sexual activity: Not on file  Other Topics Concern  . 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 Heart disease Father 67       MI  . Heart disease Other        GRANDFATHER  . Cancer Maternal Aunt        PANCREATIC  . Pancreatic cancer Maternal Aunt   . Breast cancer Mother        dx age 22   . Hypertension Neg Hx   . Diabetes Neg Hx   . Colon cancer Neg Hx   . Prostate cancer Neg Hx   . Colon polyps Neg Hx   . Esophageal cancer Neg Hx   . Rectal cancer Neg Hx   . Stomach cancer Neg Hx      Allergies as of 03/29/2018   No Known Allergies  Medication List        Accurate as of 03/29/18 11:59 PM. Always use your most recent med list.          amLODipine 5 MG tablet Commonly known as:  NORVASC Take 1 tablet (5 mg total) by mouth daily.   aspirin 81 MG tablet Take 81 mg by mouth daily.   atorvastatin 40 MG tablet Commonly known as:  LIPITOR Take 1 tablet (40 mg total) by mouth daily.   losartan 50 MG tablet Commonly known as:  COZAAR Take 1 tablet (50 mg total) by mouth daily.   pantoprazole 40 MG tablet Commonly known as:  PROTONIX Take 1 tablet (40 mg total) by mouth daily before breakfast.   Zoster Vaccine Adjuvanted injection Commonly known as:  SHINGRIX Inject 0.5 mLs into the muscle once for 1 dose.          Objective:   Physical Exam BP 116/72 (BP Location: Left Arm, Patient Position: Sitting, Cuff Size: Normal)   Pulse 62   Temp 97.8 F (36.6 C) (Oral)   Resp 16   Ht 5\' 11"  (1.803 m)   Wt 205 lb 8 oz (93.2 kg)   SpO2 98%   BMI 28.66 kg/m  General: Well developed, NAD, see BMI.  Neck: No  thyromegaly  HEENT:  Normocephalic . Face symmetric, atraumatic Lungs:  CTA B Normal respiratory effort, no intercostal retractions, no accessory  muscle use. Heart: RRR,  no murmur.  No pretibial edema bilaterally  Abdomen:  Not distended, soft, non-tender. No rebound or rigidity.   Skin: Exposed areas without rash. Not pale. Not jaundice Neurologic:  alert & oriented X3.  Speech normal, gait appropriate for age and unassisted Strength symmetric and appropriate for age.  Psych: Cognition and judgment appear intact.  Cooperative with normal attention span and concentration.  Behavior appropriate. No anxious or depressed appearing.     Assessment & Plan:   Assessment  HTN Hyperlipidemia Barrett's esophagus --  EGD 09-2015, + Barrett's, recheck 5 years, PPIs optional per guidelines R Prostate nodule, saw urology ~ 2014 ,small Ca++ area, no Bx H/o urolithiasis Insomnia Back pain: On and off, saw Dr Nelva Bush ~08-2014, MRI done, rx conservative treatment +FH CAD father age 13   PLAN:  HTN, hyperlipidemia: Continue present care, checking labs. Barrett's esophagus: On PPIs, asymptomatic. Prostate nodule: Checking a PSA RTC 1 year.

## 2018-03-29 NOTE — Assessment & Plan Note (Signed)
-  Td 03-2017 ;Shingle shot 05/2010 ; pnm 23: 2012; prevnar: 2015; shingrix discussed, rx provided ;  flu shot today -CCS: had a Cscope 2001 and 02-2010, and next in 10 years  Prostate cancer screening: last DRE 2018, stable; check a PSA + FH CAD: On aspirin, , controlling CV RF Labs: CMP, FLP, CBC,PSA  Cont w/ his healthy life style

## 2018-03-29 NOTE — Progress Notes (Signed)
Pre visit review using our clinic review tool, if applicable. No additional management support is needed unless otherwise documented below in the visit note. 

## 2018-03-30 NOTE — Assessment & Plan Note (Signed)
HTN, hyperlipidemia: Continue present care, checking labs. Barrett's esophagus: On PPIs, asymptomatic. Prostate nodule: Checking a PSA RTC 1 year.

## 2018-04-16 ENCOUNTER — Other Ambulatory Visit: Payer: Self-pay | Admitting: Internal Medicine

## 2018-04-27 DIAGNOSIS — H25813 Combined forms of age-related cataract, bilateral: Secondary | ICD-10-CM | POA: Diagnosis not present

## 2018-04-27 DIAGNOSIS — H40003 Preglaucoma, unspecified, bilateral: Secondary | ICD-10-CM | POA: Diagnosis not present

## 2018-06-04 ENCOUNTER — Other Ambulatory Visit: Payer: Self-pay | Admitting: Internal Medicine

## 2018-06-18 ENCOUNTER — Other Ambulatory Visit: Payer: Self-pay | Admitting: Internal Medicine

## 2018-09-04 ENCOUNTER — Other Ambulatory Visit: Payer: Self-pay | Admitting: Internal Medicine

## 2018-09-06 NOTE — Telephone Encounter (Signed)
Rx sent. MyChart message sent to Pt.  

## 2018-09-06 NOTE — Telephone Encounter (Signed)
He takes 50 mg daily, okay to refill losartan 25 mg 2 tablets daily.  Please send the patient a message so he does not get confused

## 2018-09-06 NOTE — Telephone Encounter (Signed)
Refill request for losartan 25mg : 2 tabs daily. On Pt med list is losartan 50mg  once daily. Okay to change d/t back order?

## 2018-12-01 ENCOUNTER — Other Ambulatory Visit: Payer: Self-pay | Admitting: Internal Medicine

## 2019-01-13 ENCOUNTER — Other Ambulatory Visit: Payer: Self-pay | Admitting: Internal Medicine

## 2019-02-24 DIAGNOSIS — Z23 Encounter for immunization: Secondary | ICD-10-CM | POA: Diagnosis not present

## 2019-03-01 ENCOUNTER — Other Ambulatory Visit: Payer: Self-pay | Admitting: Internal Medicine

## 2019-03-02 ENCOUNTER — Encounter: Payer: Self-pay | Admitting: Internal Medicine

## 2019-03-02 MED ORDER — LOSARTAN POTASSIUM 25 MG PO TABS: 50.0000 mg | ORAL_TABLET | Freq: Every day | ORAL | 0 refills | Status: DC

## 2019-03-14 ENCOUNTER — Other Ambulatory Visit: Payer: Self-pay | Admitting: Internal Medicine

## 2019-03-30 ENCOUNTER — Other Ambulatory Visit: Payer: Self-pay

## 2019-03-31 ENCOUNTER — Other Ambulatory Visit: Payer: Self-pay

## 2019-03-31 ENCOUNTER — Encounter: Payer: Self-pay | Admitting: Internal Medicine

## 2019-03-31 ENCOUNTER — Ambulatory Visit (INDEPENDENT_AMBULATORY_CARE_PROVIDER_SITE_OTHER): Payer: Medicare Other | Admitting: Internal Medicine

## 2019-03-31 VITALS — BP 110/94 | HR 81 | Temp 97.0°F | Resp 16 | Ht 71.0 in | Wt 217.5 lb

## 2019-03-31 DIAGNOSIS — Z0000 Encounter for general adult medical examination without abnormal findings: Secondary | ICD-10-CM

## 2019-03-31 DIAGNOSIS — E785 Hyperlipidemia, unspecified: Secondary | ICD-10-CM | POA: Diagnosis not present

## 2019-03-31 DIAGNOSIS — I10 Essential (primary) hypertension: Secondary | ICD-10-CM | POA: Diagnosis not present

## 2019-03-31 LAB — CBC WITH DIFFERENTIAL/PLATELET
Basophils Absolute: 0 10*3/uL (ref 0.0–0.1)
Basophils Relative: 0.6 % (ref 0.0–3.0)
Eosinophils Absolute: 0.1 10*3/uL (ref 0.0–0.7)
Eosinophils Relative: 1.5 % (ref 0.0–5.0)
HCT: 47.1 % (ref 39.0–52.0)
Hemoglobin: 15.7 g/dL (ref 13.0–17.0)
Lymphocytes Relative: 26.7 % (ref 12.0–46.0)
Lymphs Abs: 1.3 10*3/uL (ref 0.7–4.0)
MCHC: 33.3 g/dL (ref 30.0–36.0)
MCV: 92.4 fl (ref 78.0–100.0)
Monocytes Absolute: 0.5 10*3/uL (ref 0.1–1.0)
Monocytes Relative: 9.8 % (ref 3.0–12.0)
Neutro Abs: 3 10*3/uL (ref 1.4–7.7)
Neutrophils Relative %: 61.4 % (ref 43.0–77.0)
Platelets: 225 10*3/uL (ref 150.0–400.0)
RBC: 5.1 Mil/uL (ref 4.22–5.81)
RDW: 13.8 % (ref 11.5–15.5)
WBC: 4.9 10*3/uL (ref 4.0–10.5)

## 2019-03-31 LAB — LIPID PANEL
Cholesterol: 121 mg/dL (ref 0–200)
HDL: 35.5 mg/dL — ABNORMAL LOW (ref >39.00)
LDL Cholesterol: 71 mg/dL (ref 0–99)
NonHDL: 85.8
Total CHOL/HDL Ratio: 3
Triglycerides: 73 mg/dL (ref 0.0–149.0)
VLDL: 14.6 mg/dL (ref 0.0–40.0)

## 2019-03-31 LAB — COMPREHENSIVE METABOLIC PANEL
ALT: 22 U/L (ref 0–53)
AST: 16 U/L (ref 0–37)
Albumin: 4.7 g/dL (ref 3.5–5.2)
Alkaline Phosphatase: 78 U/L (ref 39–117)
BUN: 21 mg/dL (ref 6–23)
CO2: 26 mEq/L (ref 19–32)
Calcium: 9.6 mg/dL (ref 8.4–10.5)
Chloride: 106 mEq/L (ref 96–112)
Creatinine, Ser: 0.84 mg/dL (ref 0.40–1.50)
GFR: 89.46 mL/min (ref >60.00)
Glucose, Bld: 99 mg/dL (ref 70–99)
Potassium: 4.5 mEq/L (ref 3.5–5.1)
Sodium: 140 mEq/L (ref 135–145)
Total Bilirubin: 0.7 mg/dL (ref 0.2–1.2)
Total Protein: 7.1 g/dL (ref 6.0–8.3)

## 2019-03-31 LAB — PSA: PSA: 0.89 ng/mL (ref 0.10–4.00)

## 2019-03-31 NOTE — Progress Notes (Signed)
Subjective:    Patient ID: [name], male    DOB: 12-Oct-1945, 73 y.o.   MRN: [id number]  DOS:  03/31/2019 Type of visit - description: CPX Doing well    Review of Systems Specifically denies dysphagia or odynophagia  A 14 point review of systems is negative    Past Medical History:  Diagnosis Date  . Barrett's esophagus   . GERD (gastroesophageal reflux disease)   . Hyperlipidemia   . HYPERTENSION, MILD 02/04/2008  . Kidney stones   . Prostate nodule     Past Surgical History:  Procedure Laterality Date  . APPENDECTOMY    . COLONOSCOPY    . ESOPHAGOGASTRODUODENOSCOPY      Social History   Socioeconomic History  . Marital status: Married    Spouse name: Not on file  . Number of children: 1  . Years of education: Not on file  . Highest education level: Not on file  Occupational History  . Occupation: retired Administrator, Civil Service: [employer]  . Financial resource strain: Not on file  . Food insecurity    Worry: Not on file    Inability: Not on file  . Transportation needs    Medical: Not on file    Non-medical: Not on file  Tobacco Use  . Smoking status: Former Research scientist (life sciences)  . Smokeless tobacco: Never Used  . Tobacco comment: 40 yeras ago   Substance and Sexual Activity  . Alcohol use: Yes    Alcohol/week: 0.0 standard drinks    Comment: SOCIALLY  . Drug use: No  . Sexual activity: Not on file  Lifestyle  . Physical activity    Days per week: Not on file    Minutes per session: Not on file  . Stress: Not on file  Relationships  . Social Herbalist on phone: Not on file    Gets together: Not on file    Attends religious service: Not on file    Active member of club or organization: Not on file    Attends meetings of clubs or organizations: Not on file    Relationship status: Not on file  . Intimate partner violence    Fear of current or ex partner: Not on file    Emotionally abused: Not on file   Physically abused: Not on file    Forced sexual activity: Not on file  Other Topics Concern  . 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 Heart disease Father 38       MI  . Heart disease Other        GRANDFATHER  . Cancer Maternal Aunt        PANCREATIC  . Pancreatic cancer Maternal Aunt   . Breast cancer Mother        dx age 64   . Hypertension Neg Hx   . Diabetes Neg Hx   . Colon cancer Neg Hx   . Prostate cancer Neg Hx   . Colon polyps Neg Hx   . Esophageal cancer Neg Hx   . Rectal cancer Neg Hx   . Stomach cancer Neg Hx      Allergies as of 03/31/2019   No  Known Allergies     Medication List       Accurate as of March 31, 2019  8:41 PM. If you have any questions, ask your nurse or doctor.        amLODipine 5 MG tablet Commonly known as: NORVASC Take 1 tablet (5 mg total) by mouth daily.   aspirin 81 MG tablet Take 81 mg by mouth daily.   atorvastatin 40 MG tablet Commonly known as: LIPITOR Take 1 tablet (40 mg total) by mouth daily.   losartan 25 MG tablet Commonly known as: COZAAR Take 2 tablets (50 mg total) by mouth daily.   pantoprazole 40 MG tablet Commonly known as: PROTONIX Take 1 tablet (40 mg total) by mouth daily before breakfast.           Objective:   Physical Exam BP (!) 110/94 (BP Location: Left Arm, Patient Position: Sitting, Cuff Size: Normal)   Pulse 81   Temp (!) 97 F (36.1 C) (Temporal)   Resp 16   Ht 5\' 11"  (1.803 m)   Wt 217 lb 8 oz (98.7 kg)   SpO2 96%   BMI 30.34 kg/m  General: Well developed, NAD, BMI noted Neck: No  thyromegaly  HEENT:  Normocephalic . Face symmetric, atraumatic Lungs:  CTA B Normal respiratory effort, no intercostal retractions, no accessory muscle use. Heart: RRR,  no murmur.  No pretibial edema bilaterally   Abdomen:  Not distended, soft, non-tender. No rebound or rigidity. DRE: Brown stools, normal sphincter tone, prostate not enlarged, today was not able to feel the previously described nodule Skin: Exposed areas without rash. Not pale. Not jaundice Neurologic:  alert & oriented X3.  Speech normal, gait appropriate for age and unassisted Strength symmetric and appropriate for age.  Psych: Cognition and judgment appear intact.  Cooperative with normal attention span and concentration.  Behavior appropriate. No anxious or depressed appearing.     Assessment      Assessment  HTN Hyperlipidemia Barrett's esophagus --  EGD 09-2015, + Barrett's, recheck 5 years, PPIs optional per guidelines R Prostate nodule, saw urology ~ 2014 ,small calcification area, no Bx H/o urolithiasis Insomnia Back pain: On and off, saw Dr Nelva Bush ~08-2014, MRI done, rx conservative treatment +FH CAD father age 72   PLAN:  Here for CPX HTN: No ambulatory BPs, DBP today is slightly elevated, EKG today: NSR, no acute changes.  No change for now, continue losartan, amlodipine, check BPs at home Hyperlipidemia: On Lipitor, checking lab RTC 1 year

## 2019-03-31 NOTE — Patient Instructions (Addendum)
Please schedule Medicare Wellness with Glenard Haring.   GO TO THE LAB : Get the blood work     GO TO THE FRONT DESK Schedule your next appointment for a physical exam in 1 year       Check the  blood pressure every 2 weeks BP GOAL is between 110/65 and  135/85. If it is consistently higher or lower, let me know

## 2019-03-31 NOTE — Assessment & Plan Note (Signed)
Here for CPX HTN: No ambulatory BPs, DBP today is slightly elevated, EKG today: NSR, no acute changes.  No change for now, continue losartan, amlodipine, check BPs at home Hyperlipidemia: On Lipitor, checking lab RTC 1 year

## 2019-03-31 NOTE — Assessment & Plan Note (Signed)
-  Td 03-2017 - zoster 05/2010  - pnm 23: 2012 - prevnar: 2015 - s/p  shingrix   - Had a flu shot -CCS: had a Cscope 2001 and 02-2010, and next in 10 years  Prostate cancer screening: DRE done today, I did not feel the previously described nodule, no symptoms, check a PSA + FH CAD: On aspirin, , controlling CV RF Labs: CMP, FLP, CBC, PSA Diet and exercise discussed

## 2019-03-31 NOTE — Progress Notes (Signed)
Pre visit review using our clinic review tool, if applicable. No additional management support is needed unless otherwise documented below in the visit note. 

## 2019-04-28 DIAGNOSIS — H40003 Preglaucoma, unspecified, bilateral: Secondary | ICD-10-CM | POA: Insufficient documentation

## 2019-04-28 DIAGNOSIS — H25813 Combined forms of age-related cataract, bilateral: Secondary | ICD-10-CM | POA: Diagnosis not present

## 2019-05-30 ENCOUNTER — Other Ambulatory Visit: Payer: Self-pay | Admitting: Internal Medicine

## 2019-07-13 ENCOUNTER — Other Ambulatory Visit: Payer: Self-pay | Admitting: Internal Medicine

## 2019-08-05 ENCOUNTER — Encounter: Payer: Self-pay | Admitting: Internal Medicine

## 2019-09-12 ENCOUNTER — Other Ambulatory Visit: Payer: Self-pay | Admitting: Internal Medicine

## 2019-10-11 ENCOUNTER — Other Ambulatory Visit: Payer: Self-pay | Admitting: Internal Medicine

## 2019-12-08 ENCOUNTER — Other Ambulatory Visit: Payer: Self-pay | Admitting: Internal Medicine

## 2020-01-03 ENCOUNTER — Telehealth: Payer: Self-pay | Admitting: Internal Medicine

## 2020-01-03 NOTE — Progress Notes (Signed)
I connected with [name] today by telephone and verified that I am speaking with the correct person using two identifiers. Location patient: home Location provider: work Persons participating in the virtual visit: patient, Marine scientist.    I discussed the limitations, risks, security and privacy concerns of performing an evaluation and management service by telephone and the availability of in person appointments. I also discussed with the patient that there may be a patient responsible charge related to this service. The patient expressed understanding and verbally consented to this telephonic visit.    Interactive audio and video telecommunications were attempted between this provider and patient, however failed, due to patient having technical difficulties OR patient did not have access to video capability.  We continued and completed visit with audio only.  Some vital signs may be absent or patient reported.   Subjective:   [name] is a 74 y.o. male who presents for Medicare Annual/Subsequent preventive examination.  Review of Systems    Cardiac Risk Factors include: advanced age (>57men, >85 women);dyslipidemia;hypertension;male gender     Objective:    Today's Vitals   01/05/20 1304  Weight: (!) 220 lb (99.8 kg)   Body mass index is 30.68 kg/m.  Advanced Directives 01/05/2020 07/24/2015 06/07/2015  Does Patient Have a Medical Advance Directive? Yes Yes Yes  Type of Paramedic of [place];Living will - Living will;Healthcare Power of Attorney  Does patient want to make changes to medical advance directive? No - Patient declined - -  Copy of [place] in Chart? Yes - validated most recent copy scanned in chart (See row information) - -    Current Medications (verified) Outpatient Encounter Medications as of 01/05/2020  Medication Sig   amLODipine (NORVASC) 5 MG tablet Take 1 tablet (5 mg total) by mouth daily.   aspirin 81 MG  tablet Take 81 mg by mouth daily.     atorvastatin (LIPITOR) 40 MG tablet Take 1 tablet (40 mg total) by mouth daily.   losartan (COZAAR) 25 MG tablet Take 2 tablets (50 mg total) by mouth daily.   pantoprazole (PROTONIX) 40 MG tablet Take 1 tablet (40 mg total) by mouth daily before breakfast.   No facility-administered encounter medications on file as of 01/05/2020.    Allergies (verified) Patient has no known allergies.   History: Past Medical History:  Diagnosis Date   Barrett's esophagus    GERD (gastroesophageal reflux disease)    Hyperlipidemia    HYPERTENSION, MILD 02/04/2008   Kidney stones    Prostate nodule    Past Surgical History:  Procedure Laterality Date   APPENDECTOMY     COLONOSCOPY     ESOPHAGOGASTRODUODENOSCOPY     Family History  Problem Relation Age of Onset   Heart disease Father 51       MI   Heart disease Other        GRANDFATHER   Cancer Maternal Aunt        PANCREATIC   Pancreatic cancer Maternal Aunt    Breast cancer Mother        dx age 62    Hypertension Neg Hx    Diabetes Neg Hx    Colon cancer Neg Hx    Prostate cancer Neg Hx    Colon polyps Neg Hx    Esophageal cancer Neg Hx    Rectal cancer Neg Hx    Stomach cancer Neg Hx    Social History   Socioeconomic History   Marital status:  Married    Spouse name: Not on file   Number of children: 1   Years of education: Not on file   Highest education level: Not on file  Occupational History   Occupation: retired Administrator, Civil Service: [employer]  Tobacco Use   Smoking status: Former Smoker   Smokeless tobacco: Never Used   Tobacco comment: 40 yeras ago   Substance and Sexual Activity   Alcohol use: Yes    Alcohol/week: 0.0 standard drinks    Comment: SOCIALLY   Drug use: No   Sexual activity: Not on file  Other Topics Concern   Not on file  Social History Narrative   Married  , Chief Technology Officer, 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     Lives at the  [place] , lives w/ wife   Son is a Loss adjuster, chartered of Radio broadcast assistant Strain: Low Risk    Difficulty of Paying Living Expenses: Not hard at all  Food Insecurity: No Food Insecurity   Worried About Charity fundraiser in the Last Year: Never true   Arboriculturist in the Last Year: Never true  Transportation Needs: No Transportation Needs   Lack of Transportation (Medical): No   Lack of Transportation (Non-Medical): No  Physical Activity:    Days of Exercise per Week:    Minutes of Exercise per Session:   Stress:    Feeling of Stress :   Social Connections:    Frequency of Communication with Friends and Family:    Frequency of Social Gatherings with Friends and Family:    Attends Religious Services:    Active Member of Clubs or Organizations:    Attends Archivist Meetings:    Marital Status:     Tobacco Counseling Counseling given: Not Answered Comment: 40 yeras ago    Clinical Intake: Pain : No/denies pain  Activities of Daily Living In your present state of health, do you have any difficulty performing the following activities: 01/05/2020 03/31/2019  Hearing? N N  Vision? N N  Difficulty concentrating or making decisions? N N  Walking or climbing stairs? N N  Dressing or bathing? N N  Doing errands, shopping? N N  Preparing Food and eating ? N -  Using the Toilet? N -  In the past six months, have you accidently leaked urine? N -  Do you have problems with loss of bowel control? N -  Managing your Medications? N -  Managing your Finances? N -  Housekeeping or managing your Housekeeping? N -  Some recent data might be hidden    Patient Care Team: Colon Branch, MD as PCP - General Carlean Purl Ofilia Neas, MD as Consulting Physician (Gastroenterology) Ardis Hughs, MD as Attending Physician (Urology)  Indicate any recent Medical Services you may have received from other than [facility] providers in the past  year (date may be approximate).     Assessment:   This is a routine wellness examination for [name].  Dietary issues and exercise activities discussed: Current Exercise Habits: The patient does not participate in regular exercise at present, Exercise limited by: None identified Diet (meal preparation, eat out, water intake, caffeinated beverages, dairy products, fruits and vegetables): well balanced     Goals     Increase physical activity      Depression Screen PHQ 2/9 Scores 01/05/2020 03/31/2019 03/29/2018 03/23/2017 03/20/2016 03/15/2015 03/14/2014  PHQ - 2 Score 0 0 0  0 0 0 0    Fall Risk Fall Risk  01/05/2020 03/31/2019 03/29/2018 03/23/2017 03/20/2016  Falls in the past year? 0 0 No No No  Number falls in past yr: 0 - - - -  Injury with Fall? 0 - - - -  Follow up Education provided;Falls prevention discussed Falls evaluation completed - - -   Lives with wife in 2 story home.  Any stairs in or around the home? Yes  If so, are there any without handrails? No  Home free of loose throw rugs in walkways, pet beds, electrical cords, etc? Yes  Adequate lighting in your home to reduce risk of falls? Yes   ASSISTIVE DEVICES UTILIZED TO PREVENT FALLS:  Life alert? No  Use of a cane, walker or w/c? No  Grab bars in the bathroom? Yes  Shower chair or bench in shower? Yes  Elevated toilet seat or a handicapped toilet? Yes   Cognitive Function: Ad8 score reviewed for issues:  Issues making decisions:no  Less interest in hobbies / activities: no  Repeats questions, stories (family complaining):no  Trouble using ordinary gadgets (microwave, computer, phone):no  Forgets the month or year: no  Mismanaging finances: no  Remembering appts:no  Daily problems with thinking and/or memory:no Ad8 score is=0        Immunizations Immunization History  Administered Date(s) Administered   Influenza Split 03/12/2011, 03/24/2012   Influenza Whole 06/09/2005   Influenza,  High Dose Seasonal PF 03/10/2013, 03/15/2015, 02/13/2016, 03/23/2017, 03/29/2018   Influenza,inj,Quad PF,6+ Mos 03/14/2014   Influenza-Unspecified 02/24/2019   PFIZER SARS-COV-2 Vaccination 07/05/2019, 08/02/2019   Pneumococcal Conjugate-13 03/14/2014   Pneumococcal Polysaccharide-23 06/09/2004, 03/12/2011   Td 06/09/2006, 03/23/2017   Zoster 06/04/2010   Zoster Recombinat (Shingrix) 04/08/2018, 06/10/2018    TDAP status: Up to date Flu Vaccine status: Up to date Pneumococcal vaccine status: Up to date Covid-19 vaccine status: Completed vaccines  Qualifies for Shingles Vaccine? Yes   Zostavax completed Yes   Shingrix Completed?: Yes  Screening Tests Health Maintenance  Topic Date Due   INFLUENZA VACCINE  01/08/2020   COLONOSCOPY  02/22/2020   TETANUS/TDAP  03/24/2027   COVID-19 Vaccine  Completed   Hepatitis C Screening  Completed   PNA vac Low Risk Adult  Completed    Health Maintenance  There are no preventive care reminders to display for this patient.  Colorectal cancer screening: Completed 02/21/10. Repeat every per pt - 10 years  Lung Cancer Screening: (Low Dose CT Chest recommended if Age 51-80 years, 30 pack-year currently smoking OR have quit w/in 15years.) does not qualify.    Additional Screening:  Hepatitis C Screening: does qualify; Completed 03/23/17  Vision Screening: Recommended annual ophthalmology exams for early detection of glaucoma and other disorders of the eye. Is the patient up to date with their annual eye exam?  Yes    Dental Screening: Recommended annual dental exams for proper oral hygiene  Community Resource Referral / Chronic Care Management: CRR required this visit?  No   CCM required this visit?  No      Plan:    Please schedule your next medicare wellness visit with me in 1 yr.  Continue to eat heart healthy diet (full of fruits, vegetables, whole grains, lean protein, water--limit salt, fat, and sugar intake)  and increase physical activity as tolerated.  Continue doing brain stimulating activities (puzzles, reading, adult coloring books, staying active) to keep memory sharp.    I have personally reviewed and noted the following  in the patients chart:    Medical and social history  Use of alcohol, tobacco or illicit drugs   Current medications and supplements  Functional ability and status  Nutritional status  Physical activity  Advanced directives  List of other physicians  Hospitalizations, surgeries, and ER visits in previous 12 months  Vitals  Screenings to include cognitive, depression, and falls  Referrals and appointments  In addition, I have reviewed and discussed with patient certain preventive protocols, quality metrics, and best practice recommendations. A written personalized care plan for preventive services as well as general preventive health recommendations were provided to patient.   Due to this being a telephonic visit, the after visit summary with patients personalized plan was offered to patient via mail or my-chart.  Patient would like to access on my-chart.   Shela Nevin, [place]   01/05/2020   Nurse Notes: Reads at least 2 books per week.

## 2020-01-03 NOTE — Telephone Encounter (Signed)
Left message for patient to call back and schedule Medicare Annual Wellness Visit (AWV) with Nurse Health Advisor   This should be a 39 MINUTE VISIT.  Last AWV 03/15/15

## 2020-01-05 ENCOUNTER — Ambulatory Visit (INDEPENDENT_AMBULATORY_CARE_PROVIDER_SITE_OTHER): Payer: Medicare Other | Admitting: *Deleted

## 2020-01-05 ENCOUNTER — Other Ambulatory Visit: Payer: Self-pay

## 2020-01-05 ENCOUNTER — Encounter: Payer: Self-pay | Admitting: *Deleted

## 2020-01-05 VITALS — Wt 220.0 lb

## 2020-01-05 DIAGNOSIS — Z0000 Encounter for general adult medical examination without abnormal findings: Secondary | ICD-10-CM

## 2020-01-05 NOTE — Patient Instructions (Signed)
Please schedule your next medicare wellness visit with me in 1 yr.  Continue to eat heart healthy diet (full of fruits, vegetables, whole grains, lean protein, water--limit salt, fat, and sugar intake) and increase physical activity as tolerated.  Continue doing brain stimulating activities (puzzles, reading, adult coloring books, staying active) to keep memory sharp.    [name] , Thank you for taking time to come for your Medicare Wellness Visit. I appreciate your ongoing commitment to your health goals. Please review the following plan we discussed and let me know if I can assist you in the future.   These are the goals we discussed: Goals    . Increase physical activity       This is a list of the screening recommended for you and due dates:  Health Maintenance  Topic Date Due  . Flu Shot  01/08/2020  . Colon Cancer Screening  02/22/2020  . Tetanus Vaccine  03/24/2027  . COVID-19 Vaccine  Completed  .  Hepatitis C: One time screening is recommended by Center for Disease Control  (CDC) for  adults born from [birth year] through 1965.   Completed  . Pneumonia vaccines  Completed    Preventive Care 32 Years and Older, Male Preventive care refers to lifestyle choices and visits with your health care provider that can promote health and wellness. This includes:  A yearly physical exam. This is also called an annual well check.  Regular dental and eye exams.  Immunizations.  Screening for certain conditions.  Healthy lifestyle choices, such as diet and exercise. What can I expect for my preventive care visit? Physical exam Your health care provider will check:  Height and weight. These may be used to calculate body mass index (BMI), which is a measurement that tells if you are at a healthy weight.  Heart rate and blood pressure.  Your skin for abnormal spots. Counseling Your health care provider may ask you questions about:  Alcohol, tobacco, and drug use.  Emotional  well-being.  Home and relationship well-being.  Sexual activity.  Eating habits.  History of falls.  Memory and ability to understand (cognition).  Work and work Astronomer. What immunizations do I need?  Influenza (flu) vaccine  This is recommended every year. Tetanus, diphtheria, and pertussis (Tdap) vaccine  You may need a Td booster every 10 years. Varicella (chickenpox) vaccine  You may need this vaccine if you have not already been vaccinated. Zoster (shingles) vaccine  You may need this after age 63. Pneumococcal conjugate (PCV13) vaccine  One dose is recommended after age 26. Pneumococcal polysaccharide (PPSV23) vaccine  One dose is recommended after age 29. Measles, mumps, and rubella (MMR) vaccine  You may need at least one dose of MMR if you were born in 1957 or later. You may also need a second dose. Meningococcal conjugate (MenACWY) vaccine  You may need this if you have certain conditions. Hepatitis A vaccine  You may need this if you have certain conditions or if you travel or work in places where you may be exposed to hepatitis A. Hepatitis B vaccine  You may need this if you have certain conditions or if you travel or work in places where you may be exposed to hepatitis B. Haemophilus influenzae type b (Hib) vaccine  You may need this if you have certain conditions. You may receive vaccines as individual doses or as more than one vaccine together in one shot (combination vaccines). Talk with your health care provider about the  risks and benefits of combination vaccines. What tests do I need? Blood tests  Lipid and cholesterol levels. These may be checked every 5 years, or more frequently depending on your overall health.  Hepatitis C test.  Hepatitis B test. Screening  Lung cancer screening. You may have this screening every year starting at age 61 if you have a 30-pack-year history of smoking and currently smoke or have quit within the  past 15 years.  Colorectal cancer screening. All adults should have this screening starting at age 73 and continuing until age 63. Your health care provider may recommend screening at age 48 if you are at increased risk. You will have tests every 1-10 years, depending on your results and the type of screening test.  Prostate cancer screening. Recommendations will vary depending on your family history and other risks.  Diabetes screening. This is done by checking your blood sugar (glucose) after you have not eaten for a while (fasting). You may have this done every 1-3 years.  Abdominal aortic aneurysm (AAA) screening. You may need this if you are a current or former smoker.  Sexually transmitted disease (STD) testing. Follow these instructions at home: Eating and drinking  Eat a diet that includes fresh fruits and vegetables, whole grains, lean protein, and low-fat dairy products. Limit your intake of foods with high amounts of sugar, saturated fats, and salt.  Take vitamin and mineral supplements as recommended by your health care provider.  Do not drink alcohol if your health care provider tells you not to drink.  If you drink alcohol: ? Limit how much you have to 0-2 drinks a day. ? Be aware of how much alcohol is in your drink. In the [place] one drink equals one 12 oz bottle of beer (355 mL), one 5 oz glass of wine (148 mL), or one 1 oz glass of hard liquor (44 mL). Lifestyle  Take daily care of your teeth and gums.  Stay active. Exercise for at least 30 minutes on 5 or more days each week.  Do not use any products that contain nicotine or tobacco, such as cigarettes, e-cigarettes, and chewing tobacco. If you need help quitting, ask your health care provider.  If you are sexually active, practice safe sex. Use a condom or other form of protection to prevent STIs (sexually transmitted infections).  Talk with your health care provider about taking a low-dose aspirin or  statin. What's next?  Visit your health care provider once a year for a well check visit.  Ask your health care provider how often you should have your eyes and teeth checked.  Stay up to date on all vaccines. This information is not intended to replace advice given to you by your health care provider. Make sure you discuss any questions you have with your health care provider. Document Revised: 05/20/2018 Document Reviewed: 05/20/2018 Elsevier Patient Education  2020 [employer].

## 2020-03-06 ENCOUNTER — Encounter: Payer: Self-pay | Admitting: Gastroenterology

## 2020-03-08 ENCOUNTER — Other Ambulatory Visit: Payer: Self-pay | Admitting: Internal Medicine

## 2020-03-31 ENCOUNTER — Encounter: Payer: Self-pay | Admitting: Internal Medicine

## 2020-04-04 ENCOUNTER — Encounter: Payer: Self-pay | Admitting: Internal Medicine

## 2020-04-04 ENCOUNTER — Other Ambulatory Visit: Payer: Self-pay

## 2020-04-04 ENCOUNTER — Ambulatory Visit (INDEPENDENT_AMBULATORY_CARE_PROVIDER_SITE_OTHER): Payer: Medicare Other | Admitting: Internal Medicine

## 2020-04-04 VITALS — BP 132/87 | HR 80 | Temp 97.8°F | Resp 18 | Ht 71.0 in | Wt 229.2 lb

## 2020-04-04 DIAGNOSIS — R7309 Other abnormal glucose: Secondary | ICD-10-CM

## 2020-04-04 DIAGNOSIS — Z0000 Encounter for general adult medical examination without abnormal findings: Secondary | ICD-10-CM

## 2020-04-04 DIAGNOSIS — E785 Hyperlipidemia, unspecified: Secondary | ICD-10-CM

## 2020-04-04 DIAGNOSIS — I10 Essential (primary) hypertension: Secondary | ICD-10-CM | POA: Diagnosis not present

## 2020-04-04 DIAGNOSIS — Z23 Encounter for immunization: Secondary | ICD-10-CM | POA: Diagnosis not present

## 2020-04-04 NOTE — Progress Notes (Signed)
Pre visit review using our clinic review tool, if applicable. No additional management support is needed unless otherwise documented below in the visit note. 

## 2020-04-04 NOTE — Progress Notes (Signed)
Subjective:    Patient ID: [name], male    DOB: Oct 08, 1945, 74 y.o.   MRN: [id number]  DOS:  04/04/2020 Type of visit - description: CPX In general feels well. Injured his right leg few months ago and is just bouncing back and  able to take walks regularly.   Some left knee pain.    Review of Systems  Other than above, a 14 point review of systems is negative      Past Medical History:  Diagnosis Date  . Barrett's esophagus   . GERD (gastroesophageal reflux disease)   . Hyperlipidemia   . HYPERTENSION, MILD 02/04/2008  . Kidney stones   . Prostate nodule     Past Surgical History:  Procedure Laterality Date  . APPENDECTOMY    . COLONOSCOPY    . ESOPHAGOGASTRODUODENOSCOPY      Allergies as of 04/04/2020   No Known Allergies     Medication List       Accurate as of April 04, 2020 11:59 PM. If you have any questions, ask your nurse or doctor.        amLODipine 5 MG tablet Commonly known as: NORVASC Take 1 tablet (5 mg total) by mouth daily.   aspirin 81 MG tablet Take 81 mg by mouth daily.   atorvastatin 40 MG tablet Commonly known as: LIPITOR Take 1 tablet (40 mg total) by mouth daily.   losartan 25 MG tablet Commonly known as: COZAAR TAKE TWO (2) TABLETS BY MOUTH DAILY   pantoprazole 40 MG tablet Commonly known as: PROTONIX Take 1 tablet (40 mg total) by mouth daily before breakfast.          Objective:   Physical Exam BP 132/87 (BP Location: Left Arm, Patient Position: Sitting, Cuff Size: Normal)   Pulse 80   Temp 97.8 F (36.6 C) (Oral)   Resp 18   Ht 5\' 11"  (1.803 m)   Wt 229 lb 4 oz (104 kg)   SpO2 96%   BMI 31.97 kg/m   General: Well developed, NAD, BMI noted Neck: No  thyromegaly  HEENT:  Normocephalic . Face symmetric, atraumatic Lungs:  CTA B Normal respiratory effort, no intercostal retractions, no accessory muscle use. Heart: RRR,  no murmur.  Abdomen:  Not distended, soft, non-tender. No rebound or  rigidity.   Lower extremities: no pretibial edema bilaterally  Skin: Exposed areas without rash. Not pale. Not jaundice Neurologic:  alert & oriented X3.  Speech normal, gait appropriate for age and unassisted Strength symmetric and appropriate for age.  Psych: Cognition and judgment appear intact.  Cooperative with normal attention span and concentration.  Behavior appropriate. No anxious or depressed appearing.     Assessment      Assessment  HTN Hyperlipidemia Barrett's esophagus --  EGD 09-2015, + Barrett's, recheck 5 years, PPIs optional per guidelines R Prostate nodule, saw urology ~ 2014 ,small calcification area, no Bx H/o urolithiasis Insomnia Back pain: On and off, saw Dr Nelva Bush ~08-2014, MRI done, rx conservative treatment +FH CAD father age 11   PLAN:  Here for CPX IRJ:[id number] amlodipine, losartan, checking labs, ambulatory BPs encouraged. Hyperlipidemia:On Lipitor, checking labs History of Barrett's: Asymptomatic, on PPIs MSK: Has some left knee pain.  Injured  the right leg but he is getting better.  Offered sports medicine referral, will think about it. Aspirin: H/o HTN, high cholesterol, + FH CAD. ASA pro/cons d/w pt we agreed to continue with aspirin for now. RTC 1 year, sooner if  needed   This visit occurred during the SARS-CoV-2 public health emergency.  Safety protocols were in place, including screening questions prior to the visit, additional usage of staff PPE, and extensive cleaning of exam room while observing appropriate contact time as indicated for disinfecting solutions.

## 2020-04-04 NOTE — Patient Instructions (Signed)
Check the  blood pressure regularly °BP GOAL is between 110/65 and  135/85. °If it is consistently higher or lower, let me know °  ° °GO TO THE LAB : Get the blood work   ° ° °GO TO THE FRONT DESK, PLEASE SCHEDULE YOUR APPOINTMENTS °Come back for   a physical exam in 1 year °

## 2020-04-05 ENCOUNTER — Encounter: Payer: Self-pay | Admitting: Internal Medicine

## 2020-04-05 LAB — COMPREHENSIVE METABOLIC PANEL
AG Ratio: 1.8 (calc) (ref 1.0–2.5)
ALT: 27 U/L (ref 9–46)
AST: 21 U/L (ref 10–35)
Albumin: 4.4 g/dL (ref 3.6–5.1)
Alkaline phosphatase (APISO): 76 U/L (ref 35–144)
BUN: 17 mg/dL (ref 7–25)
CO2: 22 mmol/L (ref 20–32)
Calcium: 9.1 mg/dL (ref 8.6–10.3)
Chloride: 107 mmol/L (ref 98–110)
Creat: 0.89 mg/dL (ref 0.70–1.18)
Globulin: 2.5 g/dL (calc) (ref 1.9–3.7)
Glucose, Bld: 96 mg/dL (ref 65–99)
Potassium: 4.3 mmol/L (ref 3.5–5.3)
Sodium: 141 mmol/L (ref 135–146)
Total Bilirubin: 0.6 mg/dL (ref 0.2–1.2)
Total Protein: 6.9 g/dL (ref 6.1–8.1)

## 2020-04-05 LAB — CBC WITH DIFFERENTIAL/PLATELET
Absolute Monocytes: 480 cells/uL (ref 200–950)
Basophils Absolute: 29 cells/uL (ref 0–200)
Basophils Relative: 0.6 %
Eosinophils Absolute: 82 cells/uL (ref 15–500)
Eosinophils Relative: 1.7 %
HCT: 45.6 % (ref 38.5–50.0)
Hemoglobin: 15.4 g/dL (ref 13.2–17.1)
Lymphs Abs: 1118 cells/uL (ref 850–3900)
MCH: 30.9 pg (ref 27.0–33.0)
MCHC: 33.8 g/dL (ref 32.0–36.0)
MCV: 91.6 fL (ref 80.0–100.0)
MPV: 9.4 fL (ref 7.5–12.5)
Monocytes Relative: 10 %
Neutro Abs: 3091 cells/uL (ref 1500–7800)
Neutrophils Relative %: 64.4 %
Platelets: 212 10*3/uL (ref 140–400)
RBC: 4.98 10*6/uL (ref 4.20–5.80)
RDW: 13.2 % (ref 11.0–15.0)
Total Lymphocyte: 23.3 %
WBC: 4.8 10*3/uL (ref 3.8–10.8)

## 2020-04-05 LAB — LIPID PANEL
Cholesterol: 124 mg/dL (ref <200)
HDL: 37 mg/dL — ABNORMAL LOW (ref >=40)
LDL Cholesterol (Calc): 70 mg/dL (calc)
Non-HDL Cholesterol (Calc): 87 mg/dL (calc) (ref <130)
Total CHOL/HDL Ratio: 3.4 (calc) (ref <5.0)
Triglycerides: 89 mg/dL (ref <150)

## 2020-04-05 LAB — PSA: PSA: 0.61 ng/mL (ref <=4.0)

## 2020-04-05 LAB — HEMOGLOBIN A1C
Hgb A1c MFr Bld: 5.5 % of total Hgb (ref <5.7)
Mean Plasma Glucose: 111 (calc)
eAG (mmol/L): 6.2 (calc)

## 2020-04-05 NOTE — Assessment & Plan Note (Signed)
-  Td 03-2017 - zoster 05/2010; s/p shingrex x2 - pnm 23: 2012;  prevnar: 2015 -S/p COVID vaccine including a booster -   flu shot today -CCS: had a Cscope 2001 and 02-2010, follow-up colonoscopy already scheduled Prostate cancer screening: Last DRE October 2020, PSA was 0.89.  Request a PSA check.  Will do + FH CAD: On aspirin, , controlling CV RF Labs: CMP, FLP, CBC, A1c (mild hyperglycemia before), PSA Diet discussed ; encouraged exercise 3 h a week

## 2020-04-05 NOTE — Assessment & Plan Note (Signed)
Here for CPX BUY:[id number] amlodipine, losartan, checking labs, ambulatory BPs encouraged. Hyperlipidemia:On Lipitor, checking labs History of Barrett's: Asymptomatic, on PPIs MSK: Has some left knee pain.  Injured  the right leg but he is getting better.  Offered sports medicine referral, will think about it. Aspirin: H/o HTN, high cholesterol, + FH CAD. ASA pro/cons d/w pt we agreed to continue with aspirin for now. RTC 1 year, sooner if needed

## 2020-04-23 ENCOUNTER — Other Ambulatory Visit: Payer: Self-pay

## 2020-04-23 ENCOUNTER — Encounter: Payer: Self-pay | Admitting: Internal Medicine

## 2020-04-23 ENCOUNTER — Ambulatory Visit (AMBULATORY_SURGERY_CENTER): Payer: Self-pay | Admitting: *Deleted

## 2020-04-23 VITALS — Ht 71.0 in | Wt 228.0 lb

## 2020-04-23 DIAGNOSIS — Z1211 Encounter for screening for malignant neoplasm of colon: Secondary | ICD-10-CM

## 2020-04-23 NOTE — Progress Notes (Signed)
Fully vax'd ° °No egg or soy allergy known to patient  °No issues with past sedation with any surgeries or procedures °no intubation problems in the past  °No FH of Malignant Hyperthermia °No diet pills per patient °No home 02 use per patient  °No blood thinners per patient  °Pt denies issues with constipation  °No A fib or A flutter  °EMMI video to pt or via MyChart  °COVID 19 guidelines implemented in [facility] today with Pt and RN  ° ° °Due to the COVID-19 pandemic we are asking patients to follow these guidelines. Please only bring one care partner. Please be aware that your care partner may wait in the car in the parking lot or if they feel like they will be too hot to wait in the car, they may wait in the lobby on the 4th floor. All care partners are required to wear a mask the entire time (we do not have any that we can provide them), they need to practice social distancing, and we will do a Covid check for all patient's and care partners when you arrive. Also we will check their temperature and your temperature. If the care partner waits in their car they need to stay in the parking lot the entire time and we will call them on their cell phone when the patient is ready for discharge so they can bring the car to the front of the building. Also all patient's will need to wear a mask into building. ° °

## 2020-05-01 DIAGNOSIS — H25813 Combined forms of age-related cataract, bilateral: Secondary | ICD-10-CM | POA: Diagnosis not present

## 2020-05-01 DIAGNOSIS — H40003 Preglaucoma, unspecified, bilateral: Secondary | ICD-10-CM | POA: Diagnosis not present

## 2020-05-07 ENCOUNTER — Encounter: Payer: Medicare Other | Admitting: Gastroenterology

## 2020-05-08 ENCOUNTER — Encounter: Payer: Self-pay | Admitting: Internal Medicine

## 2020-05-08 ENCOUNTER — Other Ambulatory Visit: Payer: Self-pay

## 2020-05-08 ENCOUNTER — Ambulatory Visit (AMBULATORY_SURGERY_CENTER): Payer: Medicare Other | Admitting: Internal Medicine

## 2020-05-08 VITALS — BP 126/88 | HR 64 | Temp 96.6°F | Resp 24 | Ht 71.0 in | Wt 228.0 lb

## 2020-05-08 DIAGNOSIS — D124 Benign neoplasm of descending colon: Secondary | ICD-10-CM

## 2020-05-08 DIAGNOSIS — D125 Benign neoplasm of sigmoid colon: Secondary | ICD-10-CM

## 2020-05-08 DIAGNOSIS — Z1211 Encounter for screening for malignant neoplasm of colon: Secondary | ICD-10-CM

## 2020-05-08 MED ORDER — SODIUM CHLORIDE 0.9 % IV SOLN: 500.0000 mL | Freq: Once | INTRAVENOUS | Status: DC

## 2020-05-08 NOTE — Progress Notes (Signed)
A/ox3, pleased with MAC, report to RN 

## 2020-05-08 NOTE — Patient Instructions (Addendum)
I found and removed 2 tiny polyps that look innocent and benign.  I will get them analyzed by pathology and let you know what they were.  I do not think I will be recommending another routine repeat colonoscopy given these findings and your age.  I also saw diverticulosis in the colon, this is a common condition that usually does not bother people though we hear a lot about it.  Please read the pamphlet we gave you.  I appreciate the opportunity to care for you. Gatha Mayer, MD, [facility]  Handouts given for high fiber diet, diverticulosis and polyps.   YOU HAD AN ENDOSCOPIC PROCEDURE TODAY AT [facility]:   Refer to the procedure report that was given to you for any specific questions about what was found during the examination.  If the procedure report does not answer your questions, please call your gastroenterologist to clarify.  If you requested that your care partner not be given the details of your procedure findings, then the procedure report has been included in a sealed envelope for you to review at your convenience later.  YOU SHOULD EXPECT: Some feelings of bloating in the abdomen. Passage of more gas than usual.  Walking can help get rid of the air that was put into your GI tract during the procedure and reduce the bloating. If you had a lower endoscopy (such as a colonoscopy or flexible sigmoidoscopy) you may notice spotting of blood in your stool or on the toilet paper. If you underwent a bowel prep for your procedure, you may not have a normal bowel movement for a few days.  Please Note:  You might notice some irritation and congestion in your nose or some drainage.  This is from the oxygen used during your procedure.  There is no need for concern and it should clear up in a day or so.  SYMPTOMS TO REPORT IMMEDIATELY:   Following lower endoscopy (colonoscopy or flexible sigmoidoscopy):  Excessive amounts of blood in the stool  Significant tenderness or worsening  of abdominal pains  Swelling of the abdomen that is new, acute  Fever of 100F or higher  For urgent or emergent issues, a gastroenterologist can be reached at any hour by calling [phone]. Do not use MyChart messaging for urgent concerns.    DIET:  We do recommend a small meal at first, but then you may proceed to your regular diet.  Drink plenty of fluids but you should avoid alcoholic beverages for 24 hours.  ACTIVITY:  You should plan to take it easy for the rest of today and you should NOT DRIVE or use heavy machinery until tomorrow (because of the sedation medicines used during the test).    FOLLOW UP: Our staff will call the number listed on your records 48-72 hours following your procedure to check on you and address any questions or concerns that you may have regarding the information given to you following your procedure. If we do not reach you, we will leave a message.  We will attempt to reach you two times.  During this call, we will ask if you have developed any symptoms of COVID 19. If you develop any symptoms (ie: fever, flu-like symptoms, shortness of breath, cough etc.) before then, please call [phone].  If you test positive for Covid 19 in the 2 weeks post procedure, please call and report this information to [place].    If any biopsies were taken you will be contacted by  phone or by letter within the next 1-3 weeks.  Please call us at [phone] if you have not heard about the biopsies in 3 weeks.    SIGNATURES/CONFIDENTIALITY: You and/or your care partner have signed paperwork which will be entered into your electronic medical record.  These signatures attest to the fact that that the information above on your After Visit Summary has been reviewed and is understood.  Full responsibility of the confidentiality of this discharge information lies with you and/or your care-partner.

## 2020-05-08 NOTE — Op Note (Signed)
[place] Patient Name: [name] Procedure Date: 05/08/2020 8:28 AM MRN: [id number] Endoscopist: Gatha Mayer , MD Age: 74 Referring MD:  Date of Birth: 27-Feb-1946 Gender: Male Account #: [id number] Procedure:                Colonoscopy Indications:              Screening for colorectal malignant neoplasm, Last                            colonoscopy: 2011 Medicines:                Propofol per Anesthesia, Monitored Anesthesia Care Procedure:                Pre-Anesthesia Assessment:                           - Prior to the procedure, a History and Physical                            was performed, and patient medications and                            allergies were reviewed. The patient's tolerance of                            previous anesthesia was also reviewed. The risks                            and benefits of the procedure and the sedation                            options and risks were discussed with the patient.                            All questions were answered, and informed consent                            was obtained. Prior Anticoagulants: The patient has                            taken no previous anticoagulant or antiplatelet                            agents. ASA Grade Assessment: II - A patient with                            mild systemic disease. After reviewing the risks                            and benefits, the patient was deemed in                            satisfactory condition to undergo the procedure.  After obtaining informed consent, the colonoscope                            was passed under direct vision. Throughout the                            procedure, the patient's blood pressure, pulse, and                            oxygen saturations were monitored continuously. The                            Colonoscope was introduced through the anus and                            advanced to the  the cecum, identified by                            appendiceal orifice and ileocecal valve. The                            colonoscopy was performed without difficulty. The                            patient tolerated the procedure well. The quality                            of the bowel preparation was good. The ileocecal                            valve, appendiceal orifice, and rectum were                            photographed. Scope In: 8:44:34 AM Scope Out: 9:03:10 AM Scope Withdrawal Time: 0 hours 14 minutes 43 seconds  Total Procedure Duration: 0 hours 18 minutes 36 seconds  Findings:                 The digital rectal exam findings include enlarged                            prostate. Pertinent negatives include no palpable                            rectal lesions.                           Two sessile polyps were found in the sigmoid colon                            and descending colon. The polyps were diminutive in                            size. These polyps were removed with a cold snare.  Resection and retrieval were complete. Verification                            of patient identification for the specimen was                            done. Estimated blood loss was minimal.                           Multiple small and large-mouthed diverticula were                            found in the sigmoid colon, transverse colon and                            ascending colon.                           The exam was otherwise without abnormality on                            direct and retroflexion views. Complications:            No immediate complications. Estimated Blood Loss:     Estimated blood loss was minimal. Impression:               - Enlarged prostate found on digital rectal exam.                           - Two diminutive polyps in the sigmoid colon and in                            the descending colon, removed with a cold snare.                             Resected and retrieved.                           - Diverticulosis in the sigmoid colon, in the                            transverse colon and in the ascending colon.                           - The examination was otherwise normal on direct                            and retroflexion views. Recommendation:           - Patient has a contact number available for                            emergencies. The signs and symptoms of potential                            delayed complications were  discussed with the                            patient. Return to normal activities tomorrow.                            Written discharge instructions were provided to the                            patient.                           - No recommendation at this time regarding repeat                            colonoscopy due to age.                           - Await pathology results. Gatha Mayer, MD 05/08/2020 9:13:32 AM This report has been signed electronically.

## 2020-05-08 NOTE — Progress Notes (Signed)
Called to room to assist during endoscopic procedure.  Patient ID and intended procedure confirmed with present staff. Received instructions for my participation in the procedure from the performing physician.  

## 2020-05-08 NOTE — Progress Notes (Signed)
previsit done by EM.  Pt states no changes to health hx since visit.  VS by CW.

## 2020-05-10 ENCOUNTER — Telehealth: Payer: Self-pay

## 2020-05-10 NOTE — Telephone Encounter (Signed)
  Follow up Call-  Call back number 05/08/2020  Post procedure Call Back phone  # [phone]  Permission to leave phone message Yes  Some recent data might be hidden     Patient questions:  Do you have a fever, pain , or abdominal swelling? No. Pain Score  0 *  Have you tolerated food without any problems? Yes.    Have you been able to return to your normal activities? Yes.    Do you have any questions about your discharge instructions: Diet   No. Medications  No. Follow up visit  No.  Do you have questions or concerns about your Care? No.  Actions: * If pain score is 4 or above: No action needed, pain <4. 1. Have you developed a fever since your procedure? no  2.   Have you had an respiratory symptoms (SOB or cough) since your procedure? no  3.   Have you tested positive for COVID 19 since your procedure no  4.   Have you had any family members/close contacts diagnosed with the COVID 19 since your procedure?  no   If yes to any of these questions please route to Joylene John, RN and Joella Prince, RN

## 2020-05-21 ENCOUNTER — Encounter: Payer: Self-pay | Admitting: Internal Medicine

## 2020-05-24 ENCOUNTER — Encounter: Payer: Self-pay | Admitting: Internal Medicine

## 2020-09-07 ENCOUNTER — Other Ambulatory Visit: Payer: Self-pay | Admitting: Internal Medicine

## 2020-10-04 ENCOUNTER — Other Ambulatory Visit: Payer: Self-pay | Admitting: Internal Medicine

## 2020-10-09 ENCOUNTER — Encounter: Payer: Self-pay | Admitting: Internal Medicine

## 2020-10-09 NOTE — Telephone Encounter (Signed)
LMOM informing Pt to call office to schedule virtual visit.

## 2020-10-10 ENCOUNTER — Encounter: Payer: Self-pay | Admitting: Internal Medicine

## 2020-10-10 ENCOUNTER — Telehealth: Payer: Self-pay | Admitting: *Deleted

## 2020-10-10 NOTE — Telephone Encounter (Signed)
Who Is Calling Patient / Member / Family / Caregiver Caller Name [name] Phone Number [phone] Call Type Message Only Information Provided Reason for Call Returning a Call from the Office Initial [name] states he is returning a call. Additional Comment Provided office hours. Caller declined triage. Disp. Time Disposition Final User 10/09/2020 5:15:03 PM General Information Provided Yes Silvestre Moment

## 2020-10-10 NOTE — Telephone Encounter (Addendum)
See patient's message, not interested in treatment

## 2020-11-15 DIAGNOSIS — H40003 Preglaucoma, unspecified, bilateral: Secondary | ICD-10-CM | POA: Diagnosis not present

## 2020-11-15 DIAGNOSIS — H25813 Combined forms of age-related cataract, bilateral: Secondary | ICD-10-CM | POA: Diagnosis not present

## 2020-11-15 DIAGNOSIS — H538 Other visual disturbances: Secondary | ICD-10-CM | POA: Diagnosis not present

## 2020-11-28 ENCOUNTER — Other Ambulatory Visit: Payer: Self-pay | Admitting: Internal Medicine

## 2020-11-28 ENCOUNTER — Encounter: Payer: Self-pay | Admitting: Internal Medicine

## 2020-11-28 MED ORDER — ZOLPIDEM TARTRATE 5 MG PO TABS: 5.0000 mg | ORAL_TABLET | Freq: Every evening | ORAL | 0 refills | Status: DC | PRN

## 2020-12-17 ENCOUNTER — Other Ambulatory Visit: Payer: Self-pay | Admitting: Internal Medicine

## 2021-01-28 ENCOUNTER — Ambulatory Visit (INDEPENDENT_AMBULATORY_CARE_PROVIDER_SITE_OTHER): Payer: Medicare Other

## 2021-01-28 VITALS — Ht 71.0 in | Wt 223.0 lb

## 2021-01-28 DIAGNOSIS — Z0000 Encounter for general adult medical examination without abnormal findings: Secondary | ICD-10-CM

## 2021-01-28 NOTE — Progress Notes (Addendum)
Subjective:   [name] is a 75 y.o. male who presents for Medicare Annual/Subsequent preventive examination.  I connected with Arkel today by telephone and verified that I am speaking with the correct person using two identifiers. Location patient: home Location provider: work Persons participating in the virtual visit: patient, Marine scientist.    I discussed the limitations, risks, security and privacy concerns of performing an evaluation and management service by telephone and the availability of in person appointments. I also discussed with the patient that there may be a patient responsible charge related to this service. The patient expressed understanding and verbally consented to this telephonic visit.    Interactive audio and video telecommunications were attempted between this provider and patient, however failed, due to patient having technical difficulties OR patient did not have access to video capability.  We continued and completed visit with audio only.  Some vital signs may be absent or patient reported.   Time Spent with patient on telephone encounter: 20 minutes   Review of Systems     Cardiac Risk Factors include: advanced age (>55mn, >>43women);male gender;dyslipidemia;hypertension;obesity (BMI >30kg/m2)     Objective:    Today's Vitals   01/28/21 0901  Weight: 223 lb (101.2 kg)  Height: '5\' 11"'$  (1.803 m)   Body mass index is 31.1 kg/m.  Advanced Directives 01/28/2021 05/08/2020 01/05/2020 07/24/2015 06/07/2015  Does Patient Have a Medical Advance Directive? Yes Yes Yes Yes Yes  Type of AParamed[place]of [place] will [place] will [place]amp [place] SouthLiving will - Living will;Healthcare Power of Attorney  Does patient want to make changes to medical advance directive? - No - Patient declined No - Patient declined - -  Copy of [place] Chart? Yes - validated most recent copy  scanned in chart (See row information) - Yes - validated most recent copy scanned in chart (See row information) - -    Current Medications (verified) Outpatient Encounter Medications as of 01/28/2021  Medication Sig   amLODipine (NORVASC) 5 MG tablet Take 1 tablet (5 mg total) by mouth daily.   aspirin 81 MG tablet Take 81 mg by mouth daily.     atorvastatin (LIPITOR) 40 MG tablet Take 1 tablet (40 mg total) by mouth daily.   losartan (COZAAR) 25 MG tablet TAKE TWO (2) TABLETS BY MOUTH DAILY   pantoprazole (PROTONIX) 40 MG tablet Take 1 tablet (40 mg total) by mouth daily before breakfast.   zolpidem (AMBIEN) 5 MG tablet Take 1 tablet (5 mg total) by mouth at bedtime as needed for sleep.   No facility-administered encounter medications on file as of 01/28/2021.    Allergies (verified) Patient has no known allergies.   History: Past Medical History:  Diagnosis Date   Barrett's esophagus    GERD (gastroesophageal reflux disease)    Hyperlipidemia    HYPERTENSION, MILD 02/04/2008   Kidney stones    Prostate nodule    Past Surgical History:  Procedure Laterality Date   APPENDECTOMY     COLONOSCOPY     ESOPHAGOGASTRODUODENOSCOPY     UPPER GASTROINTESTINAL ENDOSCOPY     Family History  Problem Relation Age of Onset   Heart disease Father 574      MI   Heart disease Other        GRANDFATHER   Cancer Maternal Aunt        PANCREATIC   Pancreatic cancer Maternal Aunt    Breast cancer  Mother        dx age 56    Hypertension Neg Hx    Diabetes Neg Hx    Colon cancer Neg Hx    Prostate cancer Neg Hx    Colon polyps Neg Hx    Esophageal cancer Neg Hx    Rectal cancer Neg Hx    Stomach cancer Neg Hx    Social History   Socioeconomic History   Marital status: Married    Spouse name: Not on file   Number of children: 1   Years of education: Not on file   Highest education level: Not on file  Occupational History   Occupation: retired Administrator, Civil Service: [employer]  Tobacco Use   Smoking status: Former   Smokeless tobacco: Never   Tobacco comments:    40 years ago   Substance and Sexual Activity   Alcohol use: Yes    Alcohol/week: 0.0 standard drinks    Comment: SOCIALLY   Drug use: No   Sexual activity: Not on file  Other Topics Concern   Not on file  Social History Narrative   Married  , Chief Technology Officer, 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     Lives at the [place] [facility] near [place] , lives w/ wife   Son is a Loss adjuster, chartered of Radio broadcast assistant Strain: Low Risk    Difficulty of Paying Living Expenses: Not hard at all  Food Insecurity: No Food Insecurity   Worried About Charity fundraiser in the Last Year: Never true   Arboriculturist in the Last Year: Never true  Transportation Needs: No Transportation Needs   Lack of Transportation (Medical): No   Lack of Transportation (Non-Medical): No  Physical Activity: Sufficiently Active   Days of Exercise per Week: 5 days   Minutes of Exercise per Session: 30 min  Stress: No Stress Concern Present   Feeling of Stress : Not at all  Social Connections: Socially Integrated   Frequency of Communication with Friends and Family: More than three times a week   Frequency of Social Gatherings with Friends and Family: More than three times a week   Attends Religious Services: 1 to 4 times per year   Active Member of [employer] or Organizations: Yes   Attends Archivist Meetings: 1 to 4 times per year   Marital Status: Married    Tobacco Counseling Counseling given: Not Answered Tobacco comments: 40 years ago    Clinical Intake:  Pre-visit preparation completed: Yes  Pain : No/denies pain     Nutritional Status: BMI > 30  Obese Nutritional Risks: None Diabetes: No  How often do you need to have someone help you when you read instructions, pamphlets, or other written materials from your doctor or pharmacy?: 1 - Never  Diabetic?No  Interpreter Needed?:  No  Information entered by :: Caroleen Hamman LPN   Activities of Daily Living In your present state of health, do you have any difficulty performing the following activities: 01/28/2021 04/04/2020  Hearing? N N  Vision? N N  Difficulty concentrating or making decisions? N N  Walking or climbing stairs? N N  Dressing or bathing? N N  Doing errands, shopping? N N  Preparing Food and eating ? N -  Using the Toilet? N -  In the past six months, have you accidently leaked urine? N -  Do you have problems with loss of bowel control? N -  Managing your Medications? N -  Managing your Finances? N -  Housekeeping or managing your Housekeeping? N -  Some recent data might be hidden    Patient Care Team: Colon Branch, MD as PCP - General Carlean Purl Ofilia Neas, MD as Consulting Physician (Gastroenterology) Ardis Hughs, MD as Attending Physician (Urology)  Indicate any recent Medical Services you may have received from other than [facility] providers in the past year (date may be approximate).     Assessment:   This is a routine wellness examination for Buzzy.  Hearing/Vision screen Hearing Screening - Comments:: No issues Vision Screening - Comments:: Last eye exam-2022-Dr. Kofman  Dietary issues and exercise activities discussed: Current Exercise Habits: Home exercise routine, Type of exercise: walking, Time (Minutes): 30, Frequency (Times/Week): 5, Weekly Exercise (Minutes/Week): 150, Intensity: Mild, Exercise limited by: None identified   Goals Addressed             This Visit's Progress    Increase physical activity   On track      Depression Screen PHQ 2/9 Scores 01/28/2021 04/04/2020 01/05/2020 03/31/2019 03/29/2018 03/23/2017 03/20/2016  PHQ - 2 Score 0 0 0 0 0 0 0    Fall Risk Fall Risk  01/28/2021 04/04/2020 01/05/2020 03/31/2019 03/29/2018  Falls in the past year? 0 0 0 0 No  Number falls in past yr: 0 0 0 - -  Injury with Fall? 0 0 0 - -  Follow up Falls prevention  discussed Falls evaluation completed Education provided;Falls prevention discussed Falls evaluation completed -    FALL RISK PREVENTION PERTAINING TO THE HOME:  Any stairs in or around the home? Yes  If so, are there any without handrails? No  Home free of loose throw rugs in walkways, pet beds, electrical cords, etc? Yes  Adequate lighting in your home to reduce risk of falls? Yes   ASSISTIVE DEVICES UTILIZED TO PREVENT FALLS:  Life alert? No  Use of a cane, walker or w/c? No  Grab bars in the bathroom? Yes  Shower chair or bench in shower? No  Elevated toilet seat or a handicapped toilet? No   TIMED UP AND GO:  Was the test performed? No. Phone visit   Cognitive Function:Normal cognitive status assessed by this Nurse Health Advisor. No abnormalities found.          Immunizations Immunization History  Administered Date(s) Administered   Fluad Quad(high Dose 65+) 04/04/2020   Influenza Split 03/12/2011, 03/24/2012   Influenza Whole 06/09/2005   Influenza, High Dose Seasonal PF 03/10/2013, 03/15/2015, 02/13/2016, 03/23/2017, 03/29/2018   Influenza,inj,Quad PF,6+ Mos 03/14/2014   Influenza-Unspecified 02/24/2019   PFIZER(Purple Top)SARS-COV-2 Vaccination 07/05/2019, 08/02/2019, 03/02/2020, 11/27/2020   Pneumococcal Conjugate-13 03/14/2014   Pneumococcal Polysaccharide-23 06/09/2004, 03/12/2011   Td 06/09/2006, 03/23/2017   Zoster Recombinat (Shingrix) 04/08/2018, 06/10/2018   Zoster, Live 06/04/2010    TDAP status: Up to date  Flu Vaccine status: Up to date  Pneumococcal vaccine status: Up to date  Covid-19 vaccine status: Completed vaccines  Qualifies for Shingles Vaccine? No   Zostavax completed Yes   Shingrix Completed?: Yes  Screening Tests Health Maintenance  Topic Date Due   INFLUENZA VACCINE  01/07/2021   TETANUS/TDAP  03/24/2027   COVID-19 Vaccine  Completed   Hepatitis C Screening  Completed   PNA vac Low Risk Adult  Completed   Zoster  Vaccines- Shingrix  Completed   HPV VACCINES  Aged Out    Health Maintenance  Health Maintenance Due  Topic Date  Due   INFLUENZA VACCINE  01/07/2021    Colorectal cancer screening: No longer required.   Lung Cancer Screening: (Low Dose CT Chest recommended if Age 58-80 years, 30 pack-year currently smoking OR have quit w/in 15years.) does not qualify.     Additional Screening:  Hepatitis C Screening:  Completed 03/23/2017  Vision Screening: Recommended annual ophthalmology exams for early detection of glaucoma and other disorders of the eye. Is the patient up to date with their annual eye exam?  Yes  Who is the provider or what is the name of the office in which the patient attends annual eye exams? Dr. Faylene Kurtz   Dental Screening: Recommended annual dental exams for proper oral hygiene  Community Resource Referral / Chronic Care Management: CRR required this visit?  No   CCM required this visit?  No      Plan:     I have personally reviewed and noted the following in the patient's chart:   Medical and social history Use of alcohol, tobacco or illicit drugs  Current medications and supplements including opioid prescriptions. Patient is not currently taking opioid prescriptions. Functional ability and status Nutritional status Physical activity Advanced directives List of other physicians Hospitalizations, surgeries, and ER visits in previous 12 months Vitals Screenings to include cognitive, depression, and falls Referrals and appointments  In addition, I have reviewed and discussed with patient certain preventive protocols, quality metrics, and best practice recommendations. A written personalized care plan for preventive services as well as general preventive health recommendations were provided to patient.   Due to this being a telephonic visit, the after visit summary with patients personalized plan was offered to patient via mail or my-chart. Patient would  like to access on my-chart.   Marta Antu, LPN   [id number]  Nurse Health Advisor  Nurse Notes: None  I have reviewed and agree with Health Coaches documentation.  Kathlene November, MD

## 2021-01-28 NOTE — Patient Instructions (Signed)
[name] , Thank you for taking time to complete your Medicare Wellness Visit. I appreciate your ongoing commitment to your health goals. Please review the following plan we discussed and let me know if I can assist you in the future.   Screening recommendations/referrals: Colonoscopy: No longer required Recommended yearly ophthalmology/optometry visit for glaucoma screening and checkup Recommended yearly dental visit for hygiene and checkup  Vaccinations: Influenza vaccine: Due 02/2021 Pneumococcal vaccine: Up to date Tdap vaccine: Up to date-Due 03/24/2027 Shingles vaccine: Completed vaccines   Covid-19: Up to date  Advanced directives: Copy in chart  Conditions/risks identified: See problem list  Next appointment: Follow up in one year for your annual wellness visit. 02/03/2022 @ 8:20  Preventive Care 75 Years and Older, Male Preventive care refers to lifestyle choices and visits with your health care provider that can promote health and wellness. What does preventive care include? A yearly physical exam. This is also called an annual well check. Dental exams once or twice a year. Routine eye exams. Ask your health care provider how often you should have your eyes checked. Personal lifestyle choices, including: Daily care of your teeth and gums. Regular physical activity. Eating a healthy diet. Avoiding tobacco and drug use. Limiting alcohol use. Practicing safe sex. Taking low doses of aspirin every day. Taking vitamin and mineral supplements as recommended by your health care provider. What happens during an annual well check? The services and screenings done by your health care provider during your annual well check will depend on your age, overall health, lifestyle risk factors, and family history of disease. Counseling  Your health care provider may ask you questions about your: Alcohol use. Tobacco use. Drug use. Emotional well-being. Home and relationship  well-being. Sexual activity. Eating habits. History of falls. Memory and ability to understand (cognition). Work and work Statistician. Screening  You may have the following tests or measurements: Height, weight, and BMI. Blood pressure. Lipid and cholesterol levels. These may be checked every 5 years, or more frequently if you are over 23 years old. Skin check. Lung cancer screening. You may have this screening every year starting at age 75 if you have a 30-pack-year history of smoking and currently smoke or have quit within the past 15 years. Fecal occult blood test (FOBT) of the stool. You may have this test every year starting at age 75. Flexible sigmoidoscopy or colonoscopy. You may have a sigmoidoscopy every 5 years or a colonoscopy every 10 years starting at age 76. Prostate cancer screening. Recommendations will vary depending on your family history and other risks. Hepatitis C blood test. Hepatitis B blood test. Sexually transmitted disease (STD) testing. Diabetes screening. This is done by checking your blood sugar (glucose) after you have not eaten for a while (fasting). You may have this done every 1-3 years. Abdominal aortic aneurysm (AAA) screening. You may need this if you are a current or former smoker. Osteoporosis. You may be screened starting at age 75 if you are at high risk. Talk with your health care provider about your test results, treatment options, and if necessary, the need for more tests. Vaccines  Your health care provider may recommend certain vaccines, such as: Influenza vaccine. This is recommended every year. Tetanus, diphtheria, and acellular pertussis (Tdap, Td) vaccine. You may need a Td booster every 10 years. Zoster vaccine. You may need this after age 75. Pneumococcal 13-valent conjugate (PCV13) vaccine. One dose is recommended after age 75. Pneumococcal polysaccharide (PPSV23) vaccine. One dose is  recommended after age 60. Talk to your health care  provider about which screenings and vaccines you need and how often you need them. This information is not intended to replace advice given to you by your health care provider. Make sure you discuss any questions you have with your health care provider. Document Released: 06/22/2015 Document Revised: 02/13/2016 Document Reviewed: 03/27/2015 Elsevier Interactive Patient Education  2017 [place] Prevention in the Home Falls can cause injuries. They can happen to people of all ages. There are many things you can do to make your home safe and to help prevent falls. What can I do on the outside of my home? Regularly fix the edges of walkways and driveways and fix any cracks. Remove anything that might make you trip as you walk through a door, such as a raised step or threshold. Trim any bushes or trees on the path to your home. Use bright outdoor lighting. Clear any walking paths of anything that might make someone trip, such as rocks or tools. Regularly check to see if handrails are loose or broken. Make sure that both sides of any steps have handrails. Any raised decks and porches should have guardrails on the edges. Have any leaves, snow, or ice cleared regularly. Use sand or salt on walking paths during winter. Clean up any spills in your garage right away. This includes oil or grease spills. What can I do in the bathroom? Use night lights. Install grab bars by the toilet and in the tub and shower. Do not use towel bars as grab bars. Use non-skid mats or decals in the tub or shower. If you need to sit down in the shower, use a plastic, non-slip stool. Keep the floor dry. Clean up any water that spills on the floor as soon as it happens. Remove soap buildup in the tub or shower regularly. Attach bath mats securely with double-sided non-slip rug tape. Do not have throw rugs and other things on the floor that can make you trip. What can I do in the bedroom? Use night lights. Make  sure that you have a light by your bed that is easy to reach. Do not use any sheets or blankets that are too big for your bed. They should not hang down onto the floor. Have a firm chair that has side arms. You can use this for support while you get dressed. Do not have throw rugs and other things on the floor that can make you trip. What can I do in the kitchen? Clean up any spills right away. Avoid walking on wet floors. Keep items that you use a lot in easy-to-reach places. If you need to reach something above you, use a strong step stool that has a grab bar. Keep electrical cords out of the way. Do not use floor polish or wax that makes floors slippery. If you must use wax, use non-skid floor wax. Do not have throw rugs and other things on the floor that can make you trip. What can I do with my stairs? Do not leave any items on the stairs. Make sure that there are handrails on both sides of the stairs and use them. Fix handrails that are broken or loose. Make sure that handrails are as long as the stairways. Check any carpeting to make sure that it is firmly attached to the stairs. Fix any carpet that is loose or worn. Avoid having throw rugs at the top or bottom of the stairs. If  you do have throw rugs, attach them to the floor with carpet tape. Make sure that you have a light switch at the top of the stairs and the bottom of the stairs. If you do not have them, ask someone to add them for you. What else can I do to help prevent falls? Wear shoes that: Do not have high heels. Have rubber bottoms. Are comfortable and fit you well. Are closed at the toe. Do not wear sandals. If you use a stepladder: Make sure that it is fully opened. Do not climb a closed stepladder. Make sure that both sides of the stepladder are locked into place. Ask someone to hold it for you, if possible. Clearly mark and make sure that you can see: Any grab bars or handrails. First and last steps. Where the  edge of each step is. Use tools that help you move around (mobility aids) if they are needed. These include: Canes. Walkers. Scooters. Crutches. Turn on the lights when you go into a dark area. Replace any light bulbs as soon as they burn out. Set up your furniture so you have a clear path. Avoid moving your furniture around. If any of your floors are uneven, fix them. If there are any pets around you, be aware of where they are. Review your medicines with your doctor. Some medicines can make you feel dizzy. This can increase your chance of falling. Ask your doctor what other things that you can do to help prevent falls. This information is not intended to replace advice given to you by your health care provider. Make sure you discuss any questions you have with your health care provider. Document Released: 03/22/2009 Document Revised: 11/01/2015 Document Reviewed: 06/30/2014 Elsevier Interactive Patient Education  2017 [employer].

## 2021-03-05 ENCOUNTER — Other Ambulatory Visit: Payer: Self-pay | Admitting: Internal Medicine

## 2021-03-18 ENCOUNTER — Other Ambulatory Visit: Payer: Self-pay | Admitting: Internal Medicine

## 2021-03-25 ENCOUNTER — Other Ambulatory Visit: Payer: Self-pay | Admitting: Internal Medicine

## 2021-03-27 ENCOUNTER — Encounter: Payer: Self-pay | Admitting: Internal Medicine

## 2021-04-08 ENCOUNTER — Encounter: Payer: Self-pay | Admitting: Internal Medicine

## 2021-04-08 ENCOUNTER — Ambulatory Visit (INDEPENDENT_AMBULATORY_CARE_PROVIDER_SITE_OTHER): Payer: Medicare Other | Admitting: Internal Medicine

## 2021-04-08 ENCOUNTER — Other Ambulatory Visit: Payer: Self-pay

## 2021-04-08 VITALS — BP 144/80 | HR 80 | Temp 97.7°F | Resp 16 | Ht 71.0 in | Wt 230.0 lb

## 2021-04-08 DIAGNOSIS — E785 Hyperlipidemia, unspecified: Secondary | ICD-10-CM | POA: Diagnosis not present

## 2021-04-08 DIAGNOSIS — Z0000 Encounter for general adult medical examination without abnormal findings: Secondary | ICD-10-CM

## 2021-04-08 DIAGNOSIS — I10 Essential (primary) hypertension: Secondary | ICD-10-CM | POA: Diagnosis not present

## 2021-04-08 DIAGNOSIS — R7309 Other abnormal glucose: Secondary | ICD-10-CM

## 2021-04-08 LAB — CBC WITH DIFFERENTIAL/PLATELET
Basophils Absolute: 0 10*3/uL (ref 0.0–0.1)
Basophils Relative: 0.5 % (ref 0.0–3.0)
Eosinophils Absolute: 0.1 10*3/uL (ref 0.0–0.7)
Eosinophils Relative: 1.7 % (ref 0.0–5.0)
HCT: 46.7 % (ref 39.0–52.0)
Hemoglobin: 15.5 g/dL (ref 13.0–17.0)
Lymphocytes Relative: 24.4 % (ref 12.0–46.0)
Lymphs Abs: 1.1 10*3/uL (ref 0.7–4.0)
MCHC: 33.3 g/dL (ref 30.0–36.0)
MCV: 92.5 fl (ref 78.0–100.0)
Monocytes Absolute: 0.5 10*3/uL (ref 0.1–1.0)
Monocytes Relative: 9.6 % (ref 3.0–12.0)
Neutro Abs: 3 10*3/uL (ref 1.4–7.7)
Neutrophils Relative %: 63.8 % (ref 43.0–77.0)
Platelets: 223 10*3/uL (ref 150.0–400.0)
RBC: 5.05 Mil/uL (ref 4.22–5.81)
RDW: 13.9 % (ref 11.5–15.5)
WBC: 4.7 10*3/uL (ref 4.0–10.5)

## 2021-04-08 LAB — LIPID PANEL
Cholesterol: 125 mg/dL (ref 0–200)
HDL: 35.5 mg/dL — ABNORMAL LOW (ref >39.00)
LDL Cholesterol: 70 mg/dL (ref 0–99)
NonHDL: 89.16
Total CHOL/HDL Ratio: 4
Triglycerides: 95 mg/dL (ref 0.0–149.0)
VLDL: 19 mg/dL (ref 0.0–40.0)

## 2021-04-08 LAB — COMPREHENSIVE METABOLIC PANEL
ALT: 25 U/L (ref 0–53)
AST: 19 U/L (ref 0–37)
Albumin: 4.6 g/dL (ref 3.5–5.2)
Alkaline Phosphatase: 78 U/L (ref 39–117)
BUN: 15 mg/dL (ref 6–23)
CO2: 25 mEq/L (ref 19–32)
Calcium: 9.4 mg/dL (ref 8.4–10.5)
Chloride: 105 mEq/L (ref 96–112)
Creatinine, Ser: 0.86 mg/dL (ref 0.40–1.50)
GFR: 84.73 mL/min (ref >60.00)
Glucose, Bld: 104 mg/dL — ABNORMAL HIGH (ref 70–99)
Potassium: 4.1 mEq/L (ref 3.5–5.1)
Sodium: 140 mEq/L (ref 135–145)
Total Bilirubin: 0.9 mg/dL (ref 0.2–1.2)
Total Protein: 7.1 g/dL (ref 6.0–8.3)

## 2021-04-08 LAB — PSA: PSA: 0.78 ng/mL (ref 0.10–4.00)

## 2021-04-08 LAB — TSH: TSH: 1.36 u[IU]/mL (ref 0.35–5.50)

## 2021-04-08 NOTE — Patient Instructions (Signed)
Check the  blood pressure every week BP GOAL is between 110/65 and  135/85. If it is consistently higher or lower, let me know    GO TO THE LAB : Get the blood work     [place], [place] back for a physical exam in 1 year    "Living will", "[place] of attorney": Advanced care planning  (If you already have a living will or healthcare power of attorney, please bring the copy to be scanned in your chart.)  Advance care planning is a process that supports adults in  understanding and sharing their preferences regarding future medical care.   The patient's preferences are recorded in documents called Advance Directives.    Advanced directives are completed (and can be modified at any time) while the patient is in full mental capacity.   The documentation should be available at all times to the patient, the family and the healthcare providers.  Bring in a copy to be scanned in your chart is an excellent idea and is recommended   This legal documents direct treatment decision making and/or appoint a surrogate to make the decision if the patient is not capable to do so.    Advance directives can be documented in many types of formats,  documents have names such as:  Lliving will  Durable power of attorney for healthcare (healthcare proxy or healthcare power of attorney)  Combined directives  Physician orders for life-sustaining treatment    More information at:  [email or web address]

## 2021-04-08 NOTE — Assessment & Plan Note (Signed)
Here for CPX HTN: Ambulatory BPs in the 130s/80, continue present care. Hyperlipidemia:On Lipitor, checking labs Barrett's esophagus: asx, on PPIs Insomnia: Rarely takes Ambien, contract signed. + FH CAD father age 75: For now continue aspirin low-dose. RTC 1 year.

## 2021-04-08 NOTE — Progress Notes (Signed)
Subjective:    Patient ID: [name], male    DOB: 02/08/1946, 75 y.o.   MRN: [id number]  DOS:  04/08/2021 Type of visit - description: CPX  Since the last office visit is doing well, had a mild case of COVID.  Review of Systems  Other than above, a 14 point review of systems is negative    Past Medical History:  Diagnosis Date   Barrett's esophagus    GERD (gastroesophageal reflux disease)    Hyperlipidemia    HYPERTENSION, MILD 02/04/2008   Kidney stones    Prostate nodule     Past Surgical History:  Procedure Laterality Date   APPENDECTOMY     COLONOSCOPY     ESOPHAGOGASTRODUODENOSCOPY     UPPER GASTROINTESTINAL ENDOSCOPY     Social History   Socioeconomic History   Marital status: Married    Spouse name: Not on file   Number of children: 1   Years of education: Not on file   Highest education level: Not on file  Occupational History   Occupation: retired Administrator, Civil Service: [employer]  Tobacco Use   Smoking status: Former   Smokeless tobacco: Never   Tobacco comments:    40 years ago   Substance and Sexual Activity   Alcohol use: Yes    Alcohol/week: 0.0 standard drinks    Comment: SOCIALLY   Drug use: No   Sexual activity: Not on file  Other Topics Concern   Not on file  Social History Narrative   Married  , Chief Technology Officer, 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     Lives at the [place] near [place] , lives w/ wife   Son is a Loss adjuster, chartered of Radio broadcast assistant Strain: Low Risk    Difficulty of Paying Living Expenses: Not hard at all  Food Insecurity: No Food Insecurity   Worried About Charity fundraiser in the Last Year: Never true   Arboriculturist in the Last Year: Never true  Transportation Needs: No Transportation Needs   Lack of Transportation (Medical): No   Lack of Transportation (Non-Medical): No  Physical Activity: Sufficiently Active   Days of Exercise per Week: 5 days   Minutes of Exercise per Session: 30  min  Stress: No Stress Concern Present   Feeling of Stress : Not at all  Social Connections: Socially Integrated   Frequency of Communication with Friends and Family: More than three times a week   Frequency of Social Gatherings with Friends and Family: More than three times a week   Attends Religious Services: 1 to 4 times per year   Active Member of [employer] or Organizations: Yes   Attends Archivist Meetings: 1 to 4 times per year   Marital Status: Married  Human resources officer Violence: Not At Risk   Fear of Current or Ex-Partner: No   Emotionally Abused: No   Physically Abused: No   Sexually Abused: No    Allergies as of 04/08/2021   No Known Allergies      Medication List        Accurate as of April 08, 2021  3:12 PM. If you have any questions, ask your nurse or doctor.          amLODipine 5 MG tablet Commonly known as: NORVASC TAKE ONE (1) TABLET BY MOUTH EVERY DAY   aspirin 81 MG tablet Take 81 mg by mouth daily.   atorvastatin  40 MG tablet Commonly known as: LIPITOR Take 1 tablet (40 mg total) by mouth daily.   losartan 25 MG tablet Commonly known as: COZAAR TAKE TWO (2) TABLETS BY MOUTH DAILY   pantoprazole 40 MG tablet Commonly known as: PROTONIX Take 1 tablet (40 mg total) by mouth daily before breakfast.   zolpidem 5 MG tablet Commonly known as: AMBIEN Take 1 tablet (5 mg total) by mouth at bedtime as needed for sleep.           Objective:   Physical Exam BP (!) 144/80 (BP Location: Left Arm, Patient Position: Sitting, Cuff Size: Normal)   Pulse 80   Temp 97.7 F (36.5 C) (Oral)   Resp 16   Ht 5\' 11"  (1.803 m)   Wt 230 lb (104.3 kg)   SpO2 95%   BMI 32.08 kg/m  General: Well developed, NAD, BMI noted Neck: No  thyromegaly  HEENT:  Normocephalic . Face symmetric, atraumatic Lungs:  CTA B Normal respiratory effort, no intercostal retractions, no accessory muscle use. Heart: RRR,  no murmur.  Abdomen:  Not distended,  soft, non-tender. No rebound or rigidity.   Lower extremities: no pretibial edema bilaterally DRE: Normal sphincter tone, no stools, prostate normal.  I did not feel a nodule today Neurologic:  alert & oriented X3.  Speech normal, gait appropriate for age and unassisted Strength symmetric and appropriate for age.  Psych: Cognition and judgment appear intact.  Cooperative with normal attention span and concentration.  Behavior appropriate. No anxious or depressed appearing.     Assessment      Assessment  HTN Hyperlipidemia Barrett's esophagus --  EGD 09-2015, + Barrett's, recheck 5 years, PPIs optional per guidelines R Prostate nodule, saw urology ~ 2014 ,small calcification area, no Bx H/o urolithiasis Insomnia Back pain: On and off, saw Dr Nelva Bush ~08-2014, MRI done, rx conservative treatment +FH CAD father age 4 Covid infex 10-2020, mild   PLAN:  Here for CPX HTN: Ambulatory BPs in the 130s/80, continue present care. Hyperlipidemia:On Lipitor, checking labs Barrett's esophagus: asx, on PPIs Insomnia: Rarely takes Ambien, contract signed. + FH CAD father age 43: For now continue aspirin low-dose. RTC 1 year.    This visit occurred during the SARS-CoV-2 public health emergency.  Safety protocols were in place, including screening questions prior to the visit, additional usage of staff PPE, and extensive cleaning of exam room while observing appropriate contact time as indicated for disinfecting solutions.

## 2021-04-08 NOTE — Assessment & Plan Note (Signed)
-  Td 03-2017 - zoster 05/2010; s/p shingrex x2 - pnm 23: 2012;  prevnar: 2015 -S/p COVID vaccine, last 03-2021 thus UTD -  had a flu shot already -CCS: had a Cscope 2001 , 02-2010, also 04-2020, no follow-up needed per GI letter. Prostate cancer screening: DRE negative, check a PSA + FH CAD: On aspirin, , controlling CV RF Labs: CMP, FLP, CBC, TSH, PSA Diet discussed ; takes walks regularly, encouraged to walk at least 3 hours a week. Healthcare POA discussed

## 2021-04-10 ENCOUNTER — Telehealth: Payer: Self-pay

## 2021-04-10 NOTE — Telephone Encounter (Signed)
-----   Message from Gatha Mayer, MD sent at 04/10/2021  8:54 AM EDT ----- Regarding: RE: EGD? Jose,  My plan was to him to come see me this year but somehow we did not get a letter out to him.  I am not sure he needs a repeat EGD based upon current diagnosis criteria for Barrett's esophagus but I would did want to sit down and talk to him about it.  I will have my RN who is CCed on this reach out to him and set up a nonurgent next available appointment in the office.  Thanks for bringing this to my attention.  Glendell Docker ----- Message ----- From: Colon Branch, MD Sent: 04/08/2021  11:52 AM EDT To: Gatha Mayer, MD, Colon Branch, MD Subject: EGD?                                           Glendell Docker, our mutual patient had a history of Barrett's, I wonder if you like to repeat the EGD at some point. Thank you [facility]

## 2021-04-10 NOTE — Telephone Encounter (Signed)
Per Dr. Celesta Aver request, called pt to schedule NON-urgent OV to discuss EGD. LVM requesting returned call.

## 2021-04-11 NOTE — Telephone Encounter (Signed)
Called pt to schedule f/u appt per Dr. Celesta Aver request. Pt scheduled 06/11/20 @ 950am.

## 2021-04-11 NOTE — Telephone Encounter (Signed)
Pt states that he was previously notified of appointment on 06/11/2020 @ 9:50 with Dr. Carlean Purl Pt verbalized understanding with all questions answered.

## 2021-05-09 DIAGNOSIS — H04129 Dry eye syndrome of unspecified lacrimal gland: Secondary | ICD-10-CM | POA: Diagnosis not present

## 2021-05-09 DIAGNOSIS — H538 Other visual disturbances: Secondary | ICD-10-CM | POA: Diagnosis not present

## 2021-05-09 DIAGNOSIS — H25813 Combined forms of age-related cataract, bilateral: Secondary | ICD-10-CM | POA: Diagnosis not present

## 2021-05-09 DIAGNOSIS — H40003 Preglaucoma, unspecified, bilateral: Secondary | ICD-10-CM | POA: Diagnosis not present

## 2021-06-11 ENCOUNTER — Encounter: Payer: Self-pay | Admitting: Internal Medicine

## 2021-06-11 ENCOUNTER — Ambulatory Visit: Payer: Medicare Other | Admitting: Internal Medicine

## 2021-06-11 VITALS — BP 130/78 | HR 86 | Ht 71.0 in | Wt 235.0 lb

## 2021-06-11 DIAGNOSIS — K2270 Barrett's esophagus without dysplasia: Secondary | ICD-10-CM | POA: Diagnosis not present

## 2021-06-11 NOTE — Progress Notes (Signed)
[name] 76 y.o. 1946/03/29 [id number]  Assessment & Plan:   Encounter Diagnosis  Name Primary?   Barrett's esophagus without dysplasia Yes   Though he may not meet criteria for the current standard of diagnosis for Barrett's esophagus its not entirely clear, he is vigorous at 88 so I think 1 more EGD and sampling of this area to rule out dysplasia or any other problems makes sense.  The risks and benefits as well as alternatives of endoscopic procedure(s) have been discussed and reviewed. All questions answered. The patient agrees to proceed.  CC: Aaron Branch, MD     Subjective:   Chief Complaint: Barrett's esophagus  HPI 76 year old man here for follow-up of Barrett's esophagus.  I have put in a recall for office visit 5 years since his last EGD which was February 2017. Dr. Larose Kells reviewed things and recommended he see me about this since we had not met about this yet.  The patient denies any dysphagia or heartburn problems.  At his EGD in February 2017 I was not sure he met strict criteria that was up-to-date i.e. he might not have had more than a centimeter of columnar tissue but he did have at least an irregular Z-line and intestinal metaplasia.  He has a hiatal hernia as well.  He has never had any dysplasia.  The plan was to consider repeating an EGD in his situation.  He remains on pantoprazole 40 mg daily.  He is open to repeating EGD and defers to my recommendation he says.  Enjoying retirement as a Programmer, multimedia.  Planning to go to [place] for vacation later this year. No Known Allergies Current Meds  Medication Sig   amLODipine (NORVASC) 5 MG tablet TAKE ONE (1) TABLET BY MOUTH EVERY DAY   aspirin 81 MG tablet Take 81 mg by mouth daily.     atorvastatin (LIPITOR) 40 MG tablet Take 1 tablet (40 mg total) by mouth daily.   losartan (COZAAR) 25 MG tablet TAKE TWO (2) TABLETS BY MOUTH DAILY   pantoprazole (PROTONIX) 40 MG tablet Take 1 tablet (40 mg total) by mouth  daily before breakfast.   zolpidem (AMBIEN) 5 MG tablet Take 1 tablet (5 mg total) by mouth at bedtime as needed for sleep.   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   Review of Systems As per HPI otherwise negative  Objective:   Physical Exam BP 130/78    Pulse 86    Ht 5' 11"  (1.803 m)    Wt 235 lb (106.6 kg)    SpO2 98%    BMI 32.78 kg/m  NAD obese wm Lungs cta Cor NL S1S2 no rmg Abd  obese, soft, Nt no HSM/mass and BS + Alert NAD

## 2021-06-11 NOTE — Patient Instructions (Signed)
You have been scheduled for an endoscopy. Please follow written instructions given to you at your visit today. If you use inhalers (even only as needed), please bring them with you on the day of your procedure.  If you are age 76 or older, your body mass index should be between 23-30. Your Body mass index is 32.78 kg/m. If this is out of the aforementioned range listed, please consider follow up with your Primary Care Provider.  If you are age 60 or younger, your body mass index should be between 19-25. Your Body mass index is 32.78 kg/m. If this is out of the aformentioned range listed, please consider follow up with your Primary Care Provider.   ________________________________________________________  The [place] [facility] providers would like to encourage you to use [facility] to communicate with providers for non-urgent requests or questions.  Due to long hold times on the telephone, sending your provider a message by [facility] may be a faster and more efficient way to get a response.  Please allow 48 business hours for a response.  Please remember that this is for non-urgent requests.  _______________________________________________________  I appreciate the opportunity to care for you. Silvano Rusk, MD, [facility]

## 2021-06-20 ENCOUNTER — Encounter: Payer: Self-pay | Admitting: Certified Registered Nurse Anesthetist

## 2021-06-21 ENCOUNTER — Encounter: Payer: Self-pay | Admitting: Internal Medicine

## 2021-06-21 ENCOUNTER — Ambulatory Visit (AMBULATORY_SURGERY_CENTER): Payer: Medicare Other | Admitting: Internal Medicine

## 2021-06-21 ENCOUNTER — Other Ambulatory Visit: Payer: Self-pay | Admitting: Internal Medicine

## 2021-06-21 VITALS — BP 118/91 | HR 79 | Temp 96.9°F | Resp 13 | Ht 71.0 in | Wt 235.0 lb

## 2021-06-21 DIAGNOSIS — K317 Polyp of stomach and duodenum: Secondary | ICD-10-CM

## 2021-06-21 DIAGNOSIS — K2270 Barrett's esophagus without dysplasia: Secondary | ICD-10-CM

## 2021-06-21 DIAGNOSIS — K449 Diaphragmatic hernia without obstruction or gangrene: Secondary | ICD-10-CM

## 2021-06-21 DIAGNOSIS — I10 Essential (primary) hypertension: Secondary | ICD-10-CM | POA: Diagnosis not present

## 2021-06-21 DIAGNOSIS — K219 Gastro-esophageal reflux disease without esophagitis: Secondary | ICD-10-CM | POA: Diagnosis not present

## 2021-06-21 MED ORDER — SODIUM CHLORIDE 0.9 % IV SOLN
500.0000 mL | Freq: Once | INTRAVENOUS | Status: DC
Start: 2021-06-21 — End: 2021-06-21

## 2021-06-21 NOTE — Progress Notes (Signed)
Late entry 1135 Robinul 0.1 mg IV given due large amount of secretions upon assessment.  MD made aware, vss 

## 2021-06-21 NOTE — Patient Instructions (Addendum)
I took biopsies of the esophagus as we discussed. Things look ok. I will relay results - as long as no deterioration I would not do routine repeat exams.  I appreciate the opportunity to care for you. Gatha Mayer, MD, [facility]   Handout on GERD & hiatal hernia given to you today  Await pathology results    YOU HAD AN ENDOSCOPIC PROCEDURE TODAY AT [facility]:   Refer to the procedure report that was given to you for any specific questions about what was found during the examination.  If the procedure report does not answer your questions, please call your gastroenterologist to clarify.  If you requested that your care partner not be given the details of your procedure findings, then the procedure report has been included in a sealed envelope for you to review at your convenience later.  YOU SHOULD EXPECT: Some feelings of bloating in the abdomen. Passage of more gas than usual.  Walking can help get rid of the air that was put into your GI tract during the procedure and reduce the bloating. If you had a lower endoscopy (such as a colonoscopy or flexible sigmoidoscopy) you may notice spotting of blood in your stool or on the toilet paper. If you underwent a bowel prep for your procedure, you may not have a normal bowel movement for a few days.  Please Note:  You might notice some irritation and congestion in your nose or some drainage.  This is from the oxygen used during your procedure.  There is no need for concern and it should clear up in a day or so.  SYMPTOMS TO REPORT IMMEDIATELY:   Following upper endoscopy (EGD)  Vomiting of blood or coffee ground material  New chest pain or pain under the shoulder blades  Painful or persistently difficult swallowing  New shortness of breath  Fever of 100F or higher  Black, tarry-looking stools  For urgent or emergent issues, a gastroenterologist can be reached at any hour by calling [phone]. Do not use MyChart messaging  for urgent concerns.    DIET:  We do recommend a small meal at first, but then you may proceed to your regular diet.  Drink plenty of fluids but you should avoid alcoholic beverages for 24 hours.  ACTIVITY:  You should plan to take it easy for the rest of today and you should NOT DRIVE or use heavy machinery until tomorrow (because of the sedation medicines used during the test).    FOLLOW UP: Our staff will call the number listed on your records 48-72 hours following your procedure to check on you and address any questions or concerns that you may have regarding the information given to you following your procedure. If we do not reach you, we will leave a message.  We will attempt to reach you two times.  During this call, we will ask if you have developed any symptoms of COVID 19. If you develop any symptoms (ie: fever, flu-like symptoms, shortness of breath, cough etc.) before then, please call [phone].  If you test positive for Covid 19 in the 2 weeks post procedure, please call and report this information to [place].    If any biopsies were taken you will be contacted by phone or by letter within the next 1-3 weeks.  Please call us at [phone] if you have not heard about the biopsies in 3 weeks.    SIGNATURES/CONFIDENTIALITY: You and/or your care partner have signed paperwork  which will be entered into your electronic medical record.  These signatures attest to the fact that that the information above on your After Visit Summary has been reviewed and is understood.  Full responsibility of the confidentiality of this discharge information lies with you and/or your care-partner.

## 2021-06-21 NOTE — Progress Notes (Signed)
History and Physical Interval Note:  06/21/2021 11:38 AM  [name]  has presented today for endoscopic procedure(s), with the diagnosis of  Encounter Diagnosis  Name Primary?   Barrett's esophagus without dysplasia Yes  .  The various methods of evaluation and treatment have been discussed with the patient and/or family. After consideration of risks, benefits and other options for treatment, the patient has consented to  the endoscopic procedure(s).   The patient's history has been reviewed, patient examined, no change in status, stable for endoscopic procedure(s).  I have reviewed the patient's chart and labs.  Questions were answered to the patient's satisfaction.     Gatha Mayer, MD, [facility]

## 2021-06-21 NOTE — Progress Notes (Signed)
VS By DT.  Pt's states no medical or surgical changes since previsit or office visit.

## 2021-06-21 NOTE — Progress Notes (Signed)
Report given to PACU, vss 

## 2021-06-21 NOTE — Progress Notes (Signed)
Called to room to assist during endoscopic procedure.  Patient ID and intended procedure confirmed with present staff. Received instructions for my participation in the procedure from the performing physician.  

## 2021-06-21 NOTE — Op Note (Signed)
[facility] Patient Name: [name] Procedure Date: 06/21/2021 11:32 AM MRN: [id number] Endoscopist: Gatha Mayer , MD Age: 76 Referring MD:  Date of Birth: [birth year] Gender: Male Account #: [id number] Procedure:                Upper GI endoscopy Indications:              Surveillance for malignancy due to personal history                            of Barrett's esophagus Medicines:                Propofol per Anesthesia, Monitored Anesthesia Care Procedure:                Pre-Anesthesia Assessment:                           - Prior to the procedure, a History and Physical                            was performed, and patient medications and                            allergies were reviewed. The patient's tolerance of                            previous anesthesia was also reviewed. The risks                            and benefits of the procedure and the sedation                            options and risks were discussed with the patient.                            All questions were answered, and informed consent                            was obtained. Prior Anticoagulants: The patient has                            taken no previous anticoagulant or antiplatelet                            agents. ASA Grade Assessment: II - A patient with                            mild systemic disease. After reviewing the risks                            and benefits, the patient was deemed in                            satisfactory condition to undergo the procedure.  After obtaining informed consent, the endoscope was                            passed under direct vision. Throughout the                            procedure, the patient's blood pressure, pulse, and                            oxygen saturations were monitored continuously. The                            GIF [id number] #[id number] was introduced through the                            mouth,  and advanced to the second part of duodenum.                            The upper GI endoscopy was accomplished without                            difficulty. The patient tolerated the procedure                            well. Scope In: Scope Out: Findings:                 There were esophageal mucosal changes secondary to                            established short-segment Barrett's disease present                            in the distal esophagus. The maximum longitudinal                            extent of these mucosal changes was 1 cm in length.                            Mucosa was biopsied with a cold forceps for                            histology. One specimen bottle was sent to                            pathology. Verification of patient identification                            for the specimen was done. Estimated blood loss was                            minimal.                           A small hiatal hernia was  present.                           Multiple diminutive sessile polyps were found in                            the gastric fundus and in the gastric body.                           The gastroesophageal flap valve was visualized                            endoscopically and classified as Hill Grade III                            (minimal fold, loose to endoscope, hiatal hernia                            likely).                           The exam was otherwise without abnormality.                           The cardia and gastric fundus were normal on                            retroflexion. Complications:            No immediate complications. Estimated Blood Loss:     Estimated blood loss was minimal. Impression:               - Esophageal mucosal changes secondary to                            established short-segment Barrett's disease.                            Biopsied. Max 1 cm length - 1 tongue without any                            lesions                            - Small hiatal hernia.                           - Multiple gastric polyps. Known innocent fundic                            gland polyps                           - Gastroesophageal flap valve classified as Hill                            Grade III (minimal fold, loose to endoscope, hiatal  hernia likely).                           - The examination was otherwise normal. Recommendation:           - Patient has a contact number available for                            emergencies. The signs and symptoms of potential                            delayed complications were discussed with the                            patient. Return to normal activities tomorrow.                            Written discharge instructions were provided to the                            patient.                           - Resume previous diet.                           - Continue present medications.                           - Await pathology results. Gatha Mayer, MD 06/21/2021 11:51:51 AM This report has been signed electronically.

## 2021-06-25 ENCOUNTER — Telehealth: Payer: Self-pay | Admitting: *Deleted

## 2021-06-25 NOTE — Telephone Encounter (Signed)
°  Follow up Call-  Call back number 06/21/2021 05/08/2020  Post procedure Call Back phone  # [phone]  Permission to leave phone message Yes Yes  Some recent data might be hidden     Patient questions:  Do you have a fever, pain , or abdominal swelling? No. Pain Score  0 *  Have you tolerated food without any problems? Yes.    Have you been able to return to your normal activities? Yes.    Do you have any questions about your discharge instructions: Diet   No. Medications  No. Follow up visit  No.  Do you have questions or concerns about your Care? No.  Actions: * If pain score is 4 or above: No action needed, pain <4.

## 2021-06-27 ENCOUNTER — Encounter: Payer: Self-pay | Admitting: Internal Medicine

## 2021-08-31 ENCOUNTER — Other Ambulatory Visit: Payer: Self-pay | Admitting: Internal Medicine

## 2021-09-23 ENCOUNTER — Other Ambulatory Visit: Payer: Self-pay | Admitting: Internal Medicine

## 2022-02-03 ENCOUNTER — Ambulatory Visit (INDEPENDENT_AMBULATORY_CARE_PROVIDER_SITE_OTHER): Payer: Medicare Other

## 2022-02-03 DIAGNOSIS — Z0000 Encounter for general adult medical examination without abnormal findings: Secondary | ICD-10-CM | POA: Diagnosis not present

## 2022-02-03 NOTE — Patient Instructions (Signed)
[name] , Thank you for taking time to come for your Medicare Wellness Visit. I appreciate your ongoing commitment to your health goals. Please review the following plan we discussed and let me know if I can assist you in the future.   These are the goals we discussed:  Goals      Increase physical activity        This is a list of the screening recommended for you and due dates:  Health Maintenance  Topic Date Due   COVID-19 Vaccine (6 - Pfizer series) 07/27/2021   Flu Shot  01/07/2022   Tetanus Vaccine  03/24/2027   Pneumonia Vaccine  Completed   Hepatitis C Screening: USPSTF Recommendation to screen - Ages 18-79 yo.  Completed   Zoster (Shingles) Vaccine  Completed   HPV Vaccine  Aged Out   Colon Cancer Screening  Discontinued     Preventive Care 61 Years and Older, Male Preventive care refers to lifestyle choices and visits with your health care provider that can promote health and wellness. What does preventive care include? A yearly physical exam. This is also called an annual well check. Dental exams once or twice a year. Routine eye exams. Ask your health care provider how often you should have your eyes checked. Personal lifestyle choices, including: Daily care of your teeth and gums. Regular physical activity. Eating a healthy diet. Avoiding tobacco and drug use. Limiting alcohol use. Practicing safe sex. Taking low doses of aspirin every day. Taking vitamin and mineral supplements as recommended by your health care provider. What happens during an annual well check? The services and screenings done by your health care provider during your annual well check will depend on your age, overall health, lifestyle risk factors, and family history of disease. Counseling  Your health care provider may ask you questions about your: Alcohol use. Tobacco use. Drug use. Emotional well-being. Home and relationship well-being. Sexual activity. Eating habits. History  of falls. Memory and ability to understand (cognition). Work and work Statistician. Screening  You may have the following tests or measurements: Height, weight, and BMI. Blood pressure. Lipid and cholesterol levels. These may be checked every 5 years, or more frequently if you are over 76 years old. Skin check. Lung cancer screening. You may have this screening every year starting at age 76 if you have a 30-pack-year history of smoking and currently smoke or have quit within the past 15 years. Fecal occult blood test (FOBT) of the stool. You may have this test every year starting at age 76. Flexible sigmoidoscopy or colonoscopy. You may have a sigmoidoscopy every 5 years or a colonoscopy every 10 years starting at age 76 Prostate cancer screening. Recommendations will vary depending on your family history and other risks. Hepatitis C blood test. Hepatitis B blood test. Sexually transmitted disease (STD) testing. Diabetes screening. This is done by checking your blood sugar (glucose) after you have not eaten for a while (fasting). You may have this done every 1-3 years. Abdominal aortic aneurysm (AAA) screening. You may need this if you are a current or former smoker. Osteoporosis. You may be screened starting at age 76 if you are at high risk. Talk with your health care provider about your test results, treatment options, and if necessary, the need for more tests. Vaccines  Your health care provider may recommend certain vaccines, such as: Influenza vaccine. This is recommended every year. Tetanus, diphtheria, and acellular pertussis (Tdap, Td) vaccine. You may need a Td  booster every 10 years. Zoster vaccine. You may need this after age 31. Pneumococcal 13-valent conjugate (PCV13) vaccine. One dose is recommended after age 76. Pneumococcal polysaccharide (PPSV23) vaccine. One dose is recommended after age 76. Talk to your health care provider about which screenings and vaccines you need  and how often you need them. This information is not intended to replace advice given to you by your health care provider. Make sure you discuss any questions you have with your health care provider. Document Released: 06/22/2015 Document Revised: 02/13/2016 Document Reviewed: 03/27/2015 Elsevier Interactive Patient Education  2017 [employer] Prevention in the Home Falls can cause injuries. They can happen to people of all ages. There are many things you can do to make your home safe and to help prevent falls. What can I do on the outside of my home? Regularly fix the edges of walkways and driveways and fix any cracks. Remove anything that might make you trip as you walk through a door, such as a raised step or threshold. Trim any bushes or trees on the path to your home. Use bright outdoor lighting. Clear any walking paths of anything that might make someone trip, such as rocks or tools. Regularly check to see if handrails are loose or broken. Make sure that both sides of any steps have handrails. Any raised decks and porches should have guardrails on the edges. Have any leaves, snow, or ice cleared regularly. Use sand or salt on walking paths during winter. Clean up any spills in your garage right away. This includes oil or grease spills. What can I do in the bathroom? Use night lights. Install grab bars by the toilet and in the tub and shower. Do not use towel bars as grab bars. Use non-skid mats or decals in the tub or shower. If you need to sit down in the shower, use a plastic, non-slip stool. Keep the floor dry. Clean up any water that spills on the floor as soon as it happens. Remove soap buildup in the tub or shower regularly. Attach bath mats securely with double-sided non-slip rug tape. Do not have throw rugs and other things on the floor that can make you trip. What can I do in the bedroom? Use night lights. Make sure that you have a light by your bed that is easy  to reach. Do not use any sheets or blankets that are too big for your bed. They should not hang down onto the floor. Have a firm chair that has side arms. You can use this for support while you get dressed. Do not have throw rugs and other things on the floor that can make you trip. What can I do in the kitchen? Clean up any spills right away. Avoid walking on wet floors. Keep items that you use a lot in easy-to-reach places. If you need to reach something above you, use a strong step stool that has a grab bar. Keep electrical cords out of the way. Do not use floor polish or wax that makes floors slippery. If you must use wax, use non-skid floor wax. Do not have throw rugs and other things on the floor that can make you trip. What can I do with my stairs? Do not leave any items on the stairs. Make sure that there are handrails on both sides of the stairs and use them. Fix handrails that are broken or loose. Make sure that handrails are as long as the stairways. Check any carpeting  to make sure that it is firmly attached to the stairs. Fix any carpet that is loose or worn. Avoid having throw rugs at the top or bottom of the stairs. If you do have throw rugs, attach them to the floor with carpet tape. Make sure that you have a light switch at the top of the stairs and the bottom of the stairs. If you do not have them, ask someone to add them for you. What else can I do to help prevent falls? Wear shoes that: Do not have high heels. Have rubber bottoms. Are comfortable and fit you well. Are closed at the toe. Do not wear sandals. If you use a stepladder: Make sure that it is fully opened. Do not climb a closed stepladder. Make sure that both sides of the stepladder are locked into place. Ask someone to hold it for you, if possible. Clearly mark and make sure that you can see: Any grab bars or handrails. First and last steps. Where the edge of each step is. Use tools that help you move  around (mobility aids) if they are needed. These include: Canes. Walkers. Scooters. Crutches. Turn on the lights when you go into a dark area. Replace any light bulbs as soon as they burn out. Set up your furniture so you have a clear path. Avoid moving your furniture around. If any of your floors are uneven, fix them. If there are any pets around you, be aware of where they are. Review your medicines with your doctor. Some medicines can make you feel dizzy. This can increase your chance of falling. Ask your doctor what other things that you can do to help prevent falls. This information is not intended to replace advice given to you by your health care provider. Make sure you discuss any questions you have with your health care provider. Document Released: 03/22/2009 Document Revised: 11/01/2015 Document Reviewed: 06/30/2014 Elsevier Interactive Patient Education  2017 [employer].

## 2022-02-03 NOTE — Progress Notes (Signed)
Subjective:   [name] is a 76 y.o. male who presents for Medicare Annual/Subsequent preventive examination.  I connected with  [name] on 02/03/22 by a audio enabled telemedicine application and verified that I am speaking with the correct person using two identifiers.  Patient Location: Home  Provider Location: Office/Clinic  I discussed the limitations of evaluation and management by telemedicine. The patient expressed understanding and agreed to proceed.   Review of Systems     Cardiac Risk Factors include: advanced age (>66mn, >>57women);hypertension;dyslipidemia;male gender     Objective:    There were no vitals filed for this visit. There is no height or weight on file to calculate BMI.     02/03/2022    8:22 AM 01/28/2021    9:04 AM 05/08/2020    7:44 AM 01/05/2020    1:03 PM 07/24/2015    1:27 PM 06/07/2015    2:26 PM  Advanced Directives  Does Patient Have a Medical Advance Directive? Yes Yes Yes Yes Yes Yes  Type of AParamedicof [place]Living will HArialLiving will HReeds SpringLiving will H[place]villeLiving will  Living will;Healthcare Power of Attorney  Does patient want to make changes to medical advance directive? No - Patient declined  No - Patient declined No - Patient declined    Copy of HO[place] Chart? No - copy requested Yes - validated most recent copy scanned in chart (See row information)  Yes - validated most recent copy scanned in chart (See row information)      Current Medications (verified) Outpatient Encounter Medications as of 02/03/2022  Medication Sig   amLODipine (NORVASC) 5 MG tablet TAKE ONE (1) TABLET BY MOUTH EVERY DAY   aspirin 81 MG tablet Take 81 mg by mouth daily.     atorvastatin (LIPITOR) 40 MG tablet TAKE ONE (1) TABLET BY MOUTH EVERY DAY   losartan (COZAAR) 25 MG tablet TAKE TWO (2) TABLETS BY MOUTH DAILY    pantoprazole (PROTONIX) 40 MG tablet TAKE 1 TABLET BY MOUTH DAILY BEFORE BREAKFAST   zolpidem (AMBIEN) 5 MG tablet Take 1 tablet (5 mg total) by mouth at bedtime as needed for sleep.   No facility-administered encounter medications on file as of 02/03/2022.    Allergies (verified) Patient has no known allergies.   History: Past Medical History:  Diagnosis Date   Barrett's esophagus    GERD (gastroesophageal reflux disease)    Hyperlipidemia    HYPERTENSION, MILD 02/04/2008   Kidney stones    Prostate nodule    Past Surgical History:  Procedure Laterality Date   APPENDECTOMY     COLONOSCOPY     ESOPHAGOGASTRODUODENOSCOPY     UPPER GASTROINTESTINAL ENDOSCOPY     Family History  Problem Relation Age of Onset   Heart disease Father 510      MI   Heart disease Other        GRANDFATHER   Cancer Maternal Aunt        PANCREATIC   Pancreatic cancer Maternal Aunt    Breast cancer Mother        dx age 76   Hypertension Neg Hx    Diabetes Neg Hx    Colon cancer Neg Hx    Prostate cancer Neg Hx    Colon polyps Neg Hx    Esophageal cancer Neg Hx    Rectal cancer Neg Hx    Stomach cancer  Neg Hx    Social History   Socioeconomic History   Marital status: Married    Spouse name: Not on file   Number of children: 1   Years of education: Not on file   Highest education level: Not on file  Occupational History   Occupation: retired Administrator, Civil Service: [employer]  Tobacco Use   Smoking status: Former    Types: Cigarettes   Smokeless tobacco: Never   Tobacco comments:    40 years ago   Vaping Use   Vaping Use: Never used  Substance and Sexual Activity   Alcohol use: Yes    Alcohol/week: 0.0 standard drinks of alcohol    Comment: SOCIALLY   Drug use: No   Sexual activity: Not on file  Other Topics Concern   Not on file  Social History Narrative   Married  , Chief Technology Officer, 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     Lives at the [place] near [place] , lives w/ wife   Son is a  Loss adjuster, chartered of Radio broadcast assistant Strain: Low Risk  (01/28/2021)   Overall Financial Resource Strain (CARDIA)    Difficulty of Paying Living Expenses: Not hard at all  Food Insecurity: No Food Insecurity (01/28/2021)   Hunger Vital Sign    Worried About Running Out of Food in the Last Year: Never true    [place] in the Last Year: Never true  Transportation Needs: No Transportation Needs (01/28/2021)   PRAPARE - Hydrologist (Medical): No    Lack of Transportation (Non-Medical): No  Physical Activity: Sufficiently Active (01/28/2021)   Exercise Vital Sign    Days of Exercise per Week: 5 days    Minutes of Exercise per Session: 30 min  Stress: No Stress Concern Present (01/28/2021)   [place]    Feeling of Stress : Not at all  Social Connections: [place] (01/28/2021)   Social Connection and Isolation Panel [NHANES]    Frequency of Communication with Friends and Family: More than three times a week    Frequency of Social Gatherings with Friends and Family: More than three times a week    Attends Religious Services: 1 to 4 times per year    Active Member of [employer] or Organizations: Yes    Attends Archivist Meetings: 1 to 4 times per year    Marital Status: Married    Tobacco Counseling Counseling given: Not Answered Tobacco comments: 40 years ago    Clinical Intake:  Pre-visit preparation completed: Yes  Pain : No/denies pain     Nutritional Risks: None Diabetes: No  How often do you need to have someone help you when you read instructions, pamphlets, or other written materials from your doctor or pharmacy?: 1 - Never  Diabetic?no   Interpreter Needed?: No  Information entered by :: [facility] of Daily Living    02/03/2022    8:24 AM  In your present state of health, do you have any difficulty  performing the following activities:  Hearing? 0  Vision? 0  Difficulty concentrating or making decisions? 0  Walking or climbing stairs? 0  Dressing or bathing? 0  Doing errands, shopping? 0  Preparing Food and eating ? N  Using the Toilet? N  In the past six months, have you accidently leaked urine? N  Do you have problems  with loss of bowel control? N  Managing your Medications? N  Managing your Finances? N  Housekeeping or managing your Housekeeping? N    Patient Care Team: Colon Branch, MD as PCP - General Carlean Purl Ofilia Neas, MD as Consulting Physician (Gastroenterology) Ardis Hughs, MD as Attending Physician (Urology)  Indicate any recent Medical Services you may have received from other than [facility] providers in the past year (date may be approximate).     Assessment:   This is a routine wellness examination for [name].  Hearing/Vision screen No results found.  Dietary issues and exercise activities discussed: Current Exercise Habits: Home exercise routine, Type of exercise: walking;stretching, Time (Minutes): 40, Frequency (Times/Week): 5, Weekly Exercise (Minutes/Week): 200, Exercise limited by: None identified   Goals Addressed             This Visit's Progress    Increase physical activity   On track      Depression Screen    02/03/2022    8:23 AM 04/08/2021    9:53 AM 01/28/2021    9:07 AM 04/04/2020    8:54 AM 01/05/2020    1:11 PM 03/31/2019    9:14 AM 03/29/2018    9:30 AM  PHQ 2/9 Scores  PHQ - 2 Score 0 0 0 0 0 0 0    Fall Risk    02/03/2022    8:22 AM 04/08/2021    9:53 AM 01/28/2021    9:06 AM 04/04/2020    8:55 AM 01/05/2020    1:11 PM  [place] in the past year? 0 1 0 0 0  Number falls in past yr: 0 0 0 0 0  Injury with Fall? 0 0 0 0 0  Risk for fall due to : No Fall Risks      Follow up Falls evaluation completed Falls evaluation completed Falls prevention discussed Falls evaluation completed Education provided;Falls  prevention discussed    FALL RISK PREVENTION PERTAINING TO THE HOME:  Any stairs in or around the home? Yes  If so, are there any without handrails? Yes  Home free of loose throw rugs in walkways, pet beds, electrical cords, etc? Yes  Adequate lighting in your home to reduce risk of falls? Yes   ASSISTIVE DEVICES UTILIZED TO PREVENT FALLS:  Life alert? No  Use of a cane, walker or w/c? No  Grab bars in the bathroom? Yes  Shower chair or bench in shower? Yes  Elevated toilet seat or a handicapped toilet? Yes   TIMED UP AND GO:  Was the test performed? No .    Cognitive Function:        02/03/2022    8:27 AM  6CIT Screen  What Year? 0 points  What month? 0 points  What time? 0 points  Count back from 20 0 points  Months in reverse 2 points  Repeat phrase 0 points  Total Score 2 points    Immunizations Immunization History  Administered Date(s) Administered   Fluad Quad(high Dose 65+) 04/04/2020   Influenza Split 03/12/2011, 03/24/2012, 03/26/2021   Influenza Whole 06/09/2005   Influenza, High Dose Seasonal PF 03/10/2013, 03/15/2015, 02/13/2016, 03/23/2017, 03/29/2018   Influenza,inj,Quad PF,6+ Mos 03/14/2014   Influenza-Unspecified 02/24/2019   PFIZER(Purple Top)SARS-COV-2 Vaccination 07/05/2019, 08/02/2019, 03/02/2020, 11/27/2020   Pfizer Covid-19 Vaccine Bivalent Booster 73yr & up 03/26/2021   Pneumococcal Conjugate-13 03/14/2014   Pneumococcal Polysaccharide-23 06/09/2004, 03/12/2011   Td 06/09/2006, 03/23/2017   Zoster Recombinat (Shingrix) 04/08/2018, 06/10/2018  Zoster, Live 06/04/2010    TDAP status: Up to date  Flu Vaccine status: Up to date  Pneumococcal vaccine status: Up to date  Covid-19 vaccine status: Completed vaccines  Qualifies for Shingles Vaccine? Yes   Zostavax completed No   Shingrix Completed?: Yes  Screening Tests Health Maintenance  Topic Date Due   COVID-19 Vaccine (6 - Pfizer series) 07/27/2021   INFLUENZA VACCINE   01/07/2022   TETANUS/TDAP  03/24/2027   Pneumonia Vaccine 62+ Years old  Completed   Hepatitis C Screening  Completed   Zoster Vaccines- Shingrix  Completed   HPV VACCINES  Aged Out   COLONOSCOPY (Pts 45-15yr Insurance coverage will need to be confirmed)  [place]aintenance Due  Topic Date Due   COVID-19 Vaccine (6 - Pfizer series) 07/27/2021   INFLUENZA VACCINE  01/07/2022    Colorectal cancer screening: No longer required.   Lung Cancer Screening: (Low Dose CT Chest recommended if Age 405-80years, 30 pack-year currently smoking OR have quit w/in 15years.) does not qualify.   Lung Cancer Screening Referral: n/a  Additional Screening:  Hepatitis C Screening: does qualify; Completed 03/23/17  Vision Screening: Recommended annual ophthalmology exams for early detection of glaucoma and other disorders of the eye. Is the patient up to date with their annual eye exam?  No  Who is the provider or what is the name of the office in which the patient attends annual eye exams? Dr. CWende NeighborsIf pt is not established with a provider, would they like to be referred to a provider to establish care? No .   Dental Screening: Recommended annual dental exams for proper oral hygiene  Community Resource Referral / Chronic Care Management: CRR required this visit?  No   CCM required this visit?  No      Plan:     I have personally reviewed and noted the following in the patient's chart:   Medical and social history Use of alcohol, tobacco or illicit drugs  Current medications and supplements including opioid prescriptions. Patient is not currently taking opioid prescriptions. Functional ability and status Nutritional status Physical activity Advanced directives List of other physicians Hospitalizations, surgeries, and ER visits in previous 12 months Vitals Screenings to include cognitive, depression, and falls Referrals and appointments  In  addition, I have reviewed and discussed with patient certain preventive protocols, quality metrics, and best practice recommendations. A written personalized care plan for preventive services as well as general preventive health recommendations were provided to patient.   Due to this being a telephonic visit, the after visit summary with patients personalized plan was offered to patient via mail or my-chart.  Patient would like to access on my-chart.   SDuard BradyChism, [place]ansom [place]  02/03/2022   Nurse Notes: none

## 2022-02-11 ENCOUNTER — Other Ambulatory Visit: Payer: Self-pay

## 2022-02-11 MED ORDER — ATORVASTATIN CALCIUM 40 MG PO TABS: 40.0000 mg | ORAL_TABLET | Freq: Every day | ORAL | 1 refills | Status: DC

## 2022-02-12 ENCOUNTER — Encounter: Payer: Self-pay | Admitting: Internal Medicine

## 2022-03-08 ENCOUNTER — Encounter: Payer: Self-pay | Admitting: Internal Medicine

## 2022-03-17 ENCOUNTER — Other Ambulatory Visit: Payer: Self-pay

## 2022-03-17 MED ORDER — PANTOPRAZOLE SODIUM 40 MG PO TBEC
40.0000 mg | DELAYED_RELEASE_TABLET | Freq: Every day | ORAL | 2 refills | Status: DC
Start: 2022-03-17 — End: 2022-12-18

## 2022-03-28 ENCOUNTER — Encounter: Payer: Self-pay | Admitting: Internal Medicine

## 2022-03-28 ENCOUNTER — Other Ambulatory Visit: Payer: Self-pay | Admitting: Internal Medicine

## 2022-04-14 ENCOUNTER — Ambulatory Visit (INDEPENDENT_AMBULATORY_CARE_PROVIDER_SITE_OTHER): Payer: Medicare Other | Admitting: Internal Medicine

## 2022-04-14 ENCOUNTER — Encounter: Payer: Self-pay | Admitting: Internal Medicine

## 2022-04-14 VITALS — BP 122/80 | HR 86 | Temp 97.7°F | Resp 18 | Ht 71.0 in | Wt 233.0 lb

## 2022-04-14 DIAGNOSIS — I10 Essential (primary) hypertension: Secondary | ICD-10-CM

## 2022-04-14 DIAGNOSIS — Z23 Encounter for immunization: Secondary | ICD-10-CM | POA: Diagnosis not present

## 2022-04-14 DIAGNOSIS — Z0000 Encounter for general adult medical examination without abnormal findings: Secondary | ICD-10-CM

## 2022-04-14 DIAGNOSIS — E785 Hyperlipidemia, unspecified: Secondary | ICD-10-CM | POA: Diagnosis not present

## 2022-04-14 LAB — COMPREHENSIVE METABOLIC PANEL
ALT: 30 U/L (ref 0–53)
AST: 20 U/L (ref 0–37)
Albumin: 4.6 g/dL (ref 3.5–5.2)
Alkaline Phosphatase: 78 U/L (ref 39–117)
BUN: 17 mg/dL (ref 6–23)
CO2: 26 mEq/L (ref 19–32)
Calcium: 9.5 mg/dL (ref 8.4–10.5)
Chloride: 105 mEq/L (ref 96–112)
Creatinine, Ser: 0.96 mg/dL (ref 0.40–1.50)
GFR: 76.79 mL/min (ref >60.00)
Glucose, Bld: 104 mg/dL — ABNORMAL HIGH (ref 70–99)
Potassium: 4.4 mEq/L (ref 3.5–5.1)
Sodium: 140 mEq/L (ref 135–145)
Total Bilirubin: 0.8 mg/dL (ref 0.2–1.2)
Total Protein: 7.3 g/dL (ref 6.0–8.3)

## 2022-04-14 LAB — CBC WITH DIFFERENTIAL/PLATELET
Basophils Absolute: 0 10*3/uL (ref 0.0–0.1)
Basophils Relative: 0.5 % (ref 0.0–3.0)
Eosinophils Absolute: 0.1 10*3/uL (ref 0.0–0.7)
Eosinophils Relative: 1.4 % (ref 0.0–5.0)
HCT: 47.8 % (ref 39.0–52.0)
Hemoglobin: 15.8 g/dL (ref 13.0–17.0)
Lymphocytes Relative: 22.5 % (ref 12.0–46.0)
Lymphs Abs: 1.2 10*3/uL (ref 0.7–4.0)
MCHC: 33.1 g/dL (ref 30.0–36.0)
MCV: 93 fl (ref 78.0–100.0)
Monocytes Absolute: 0.5 10*3/uL (ref 0.1–1.0)
Monocytes Relative: 9.7 % (ref 3.0–12.0)
Neutro Abs: 3.4 10*3/uL (ref 1.4–7.7)
Neutrophils Relative %: 65.9 % (ref 43.0–77.0)
Platelets: 224 10*3/uL (ref 150.0–400.0)
RBC: 5.14 Mil/uL (ref 4.22–5.81)
RDW: 14.1 % (ref 11.5–15.5)
WBC: 5.2 10*3/uL (ref 4.0–10.5)

## 2022-04-14 LAB — LIPID PANEL
Cholesterol: 129 mg/dL (ref 0–200)
HDL: 38.6 mg/dL — ABNORMAL LOW (ref >39.00)
LDL Cholesterol: 67 mg/dL (ref 0–99)
NonHDL: 90.16
Total CHOL/HDL Ratio: 3
Triglycerides: 116 mg/dL (ref 0.0–149.0)
VLDL: 23.2 mg/dL (ref 0.0–40.0)

## 2022-04-14 NOTE — Assessment & Plan Note (Signed)
Here for CPX HTN, high cholesterol: Continue amlodipine, losartan, Lipitor. History of Barrett's: EGD 06-2021: Short segment of Barrett's biopsied.  Multiple gastric polyp. Biopsy no dysplasia, no recall. Continue PPIs + FH CAD: Continue low-dose aspirin for now, I believe benefits>risks RTC 1 year

## 2022-04-14 NOTE — Progress Notes (Signed)
Subjective:    Patient ID: [name], male    DOB: [birth year], 75 y.o.   MRN: [id number]  DOS:  04/14/2022 Type of visit - description: CPX  Here for CPX.  Doing great.  Has no major concerns.  Review of Systems   A 14 point review of systems is negative    Past Medical History:  Diagnosis Date   Barrett's esophagus    GERD (gastroesophageal reflux disease)    Hyperlipidemia    HYPERTENSION, MILD 02/04/2008   Kidney stones    Prostate nodule     Past Surgical History:  Procedure Laterality Date   APPENDECTOMY     COLONOSCOPY     ESOPHAGOGASTRODUODENOSCOPY     UPPER GASTROINTESTINAL ENDOSCOPY     Social History   Socioeconomic History   Marital status: Married    Spouse name: Not on file   Number of children: 1   Years of education: Not on file   Highest education level: Not on file  Occupational History   Occupation: retired Administrator, Civil Service: [employer]  Tobacco Use   Smoking status: Former    Types: Cigarettes   Smokeless tobacco: Never   Tobacco comments:    40 years ago   Vaping Use   Vaping Use: Never used  Substance and Sexual Activity   Alcohol use: Yes    Alcohol/week: 0.0 standard drinks of alcohol    Comment: SOCIALLY   Drug use: No   Sexual activity: Not on file  Other Topics Concern   Not on file  Social History Narrative   Married  , Chief Technology Officer, 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     Lives at the [place] near [place] , lives w/ wife   Son is a Loss adjuster, chartered of Radio broadcast assistant Strain: Low Risk  (01/28/2021)   Overall Financial Resource Strain (CARDIA)    Difficulty of Paying Living Expenses: Not hard at all  Food Insecurity: No Food Insecurity (01/28/2021)   Hunger Vital Sign    Worried About Running Out of Food in the Last Year: Never true    [place] in the Last Year: Never true  Transportation Needs: No Transportation Needs (01/28/2021)   PRAPARE - Hydrologist  (Medical): No    Lack of Transportation (Non-Medical): No  Physical Activity: Sufficiently Active (01/28/2021)   Exercise Vital Sign    Days of Exercise per Week: 5 days    Minutes of Exercise per Session: 30 min  Stress: No Stress Concern Present (01/28/2021)   [place]    Feeling of Stress : Not at all  Social Connections: [place] (01/28/2021)   Social Connection and Isolation Panel [NHANES]    Frequency of Communication with Friends and Family: More than three times a week    Frequency of Social Gatherings with Friends and Family: More than three times a week    Attends Religious Services: 1 to 4 times per year    Active Member of [employer] or Organizations: Yes    Attends Archivist Meetings: 1 to 4 times per year    Marital Status: Married  Human resources officer Violence: Not At Risk (01/28/2021)   Humiliation, Afraid, Rape, and Kick questionnaire    Fear of Current or Ex-Partner: No    Emotionally Abused: No    Physically Abused: No    Sexually Abused: No  Current Outpatient Medications  Medication Instructions   amLODipine (NORVASC) 5 mg, Oral, Daily   aspirin 81 mg, Daily   atorvastatin (LIPITOR) 40 mg, Oral, Daily   losartan (COZAAR) 25 MG tablet TAKE TWO (2) TABLETS BY MOUTH DAILY   pantoprazole (PROTONIX) 40 mg, Oral, Daily before breakfast   zolpidem (AMBIEN) 5 mg, Oral, At bedtime PRN       Objective:   Physical Exam BP 122/80   Pulse 86   Temp 97.7 F (36.5 C) (Oral)   Resp 18   Ht '5\' 11"'$  (1.803 m)   Wt 233 lb (105.7 kg)   SpO2 96%   BMI 32.50 kg/m  General: Well developed, NAD, BMI noted Neck: No  thyromegaly  HEENT:  Normocephalic . Face symmetric, atraumatic Lungs:  CTA B Normal respiratory effort, no intercostal retractions, no accessory muscle use. Heart: RRR,  no murmur.  Abdomen:  Not distended, soft, non-tender. No rebound or rigidity.   Lower extremities: no  pretibial edema bilaterally  Skin: Exposed areas without rash. Not pale. Not jaundice Neurologic:  alert & oriented X3.  Speech normal, gait appropriate for age and unassisted Strength symmetric and appropriate for age.  Psych: Cognition and judgment appear intact.  Cooperative with normal attention span and concentration.  Behavior appropriate. No anxious or depressed appearing.     Assessment   Assessment  HTN Hyperlipidemia Barrett's esophagus --  EGD 09-2015, + Barrett's, EGD 06-2021, no recall  R Prostate nodule, saw urology ~ 2014 ,small calcification area, no Bx H/o urolithiasis Insomnia Back pain: On and off, saw Dr Nelva Bush ~08-2014, MRI done, rx conservative treatment +FH CAD father age 66   PLAN:  Here for CPX HTN, high cholesterol: Continue amlodipine, losartan, Lipitor. History of Barrett's: EGD 06-2021: Short segment of Barrett's biopsied.  Multiple gastric polyp. Biopsy no dysplasia, no recall. Continue PPIs + FH CAD: Continue low-dose aspirin for now, I believe benefits>risks RTC 1 year       This visit occurred during the SARS-CoV-2 public health emergency.  Safety protocols were in place, including screening questions prior to the visit, additional usage of staff PPE, and extensive cleaning of exam room while observing appropriate contact time as indicated for disinfecting solutions.

## 2022-04-14 NOTE — Patient Instructions (Signed)
Check the  blood pressure regularly BP GOAL is between 110/65 and  135/85. If it is consistently higher or lower, let me know    GO TO THE LAB : Get the blood work     [place], [place] back for a physical exam in 1 year    Do you have a "Living will" or "[place] of attorney"? (Advance care planning documents)  If you already have a living will or healthcare power of attorney, is recommended you bring the copy to be scanned in your chart. The document will be available to all the doctors you see in the system.  If you don't have one, please consider create one.  Advance care planning is a process that supports adults in  understanding and sharing their preferences regarding future medical care.   The patient's preferences are recorded in documents called Advance Directives and the can be modified at any time while the patient is in full mental capacity.   The documentation should be available at all times to the patient, the family and the healthcare providers.   This legal documents direct the family or surrogate to make the decision if the patient is not capable to do so.    Advance directives can be documented in many types of formats,  documents have names such as:  Lliving will  Durable power of attorney for healthcare (healthcare proxy or healthcare power of attorney)  Combined directives  Physician orders for life-sustaining treatment    More information at:  [email or web address]

## 2022-04-14 NOTE — Assessment & Plan Note (Signed)
-  Td 03-2017 - s/p RSV per pt - zoster 05/2010; s/p shingrex x2 - pnm 23: 2012;  prevnar: 2015; PNM 20: today -COVID vaccine UTD -  had a flu shot already -CCS: had a Cscope 2001 , 02-2010, also 04-2020, no follow-up needed per GI letter. Prostate cancer screening: All previous PSAs below 1.0, no symptoms.  Stop screening.  Patient in agreement. + FH CAD: On aspirin, , controlling CV RF Labs: CMP FLP CBC Lifestyle: Continue to be healthy, recently  took a trip and did extensive walking without any symptoms. Healthcare POA discussed

## 2022-05-13 DIAGNOSIS — H25811 Combined forms of age-related cataract, right eye: Secondary | ICD-10-CM | POA: Diagnosis not present

## 2022-05-13 DIAGNOSIS — H40003 Preglaucoma, unspecified, bilateral: Secondary | ICD-10-CM | POA: Diagnosis not present

## 2022-05-13 DIAGNOSIS — H538 Other visual disturbances: Secondary | ICD-10-CM | POA: Diagnosis not present

## 2022-06-16 ENCOUNTER — Other Ambulatory Visit: Payer: Self-pay

## 2022-06-16 MED ORDER — LOSARTAN POTASSIUM 25 MG PO TABS: ORAL_TABLET | ORAL | 2 refills | Status: DC

## 2022-08-12 ENCOUNTER — Other Ambulatory Visit: Payer: Self-pay | Admitting: Internal Medicine

## 2022-09-13 ENCOUNTER — Other Ambulatory Visit: Payer: Self-pay | Admitting: Internal Medicine

## 2022-10-23 DIAGNOSIS — K080 Exfoliation of teeth due to systemic causes: Secondary | ICD-10-CM | POA: Diagnosis not present

## 2022-12-18 ENCOUNTER — Other Ambulatory Visit: Payer: Self-pay | Admitting: Internal Medicine

## 2023-01-07 ENCOUNTER — Encounter (INDEPENDENT_AMBULATORY_CARE_PROVIDER_SITE_OTHER): Payer: Self-pay

## 2023-01-21 DIAGNOSIS — Z23 Encounter for immunization: Secondary | ICD-10-CM | POA: Diagnosis not present

## 2023-02-01 ENCOUNTER — Other Ambulatory Visit: Payer: Self-pay | Admitting: Internal Medicine

## 2023-02-05 NOTE — Progress Notes (Signed)
Pt did not answer phone for AWV.   This encounter was created in error - please disregard.

## 2023-03-12 ENCOUNTER — Ambulatory Visit (INDEPENDENT_AMBULATORY_CARE_PROVIDER_SITE_OTHER): Payer: Medicare Other | Admitting: *Deleted

## 2023-03-12 DIAGNOSIS — Z0000 Encounter for general adult medical examination without abnormal findings: Secondary | ICD-10-CM

## 2023-03-12 NOTE — Patient Instructions (Signed)
Mr. [name] , Thank you for taking time to come for your Medicare Wellness Visit. I appreciate your ongoing commitment to your health goals. Please review the following plan we discussed and let me know if I can assist you in the future.     This is a list of the screening recommended for you and due dates:  Health Maintenance  Topic Date Due   COVID-19 Vaccine (7 - 2023-24 season) 02/08/2023   Flu Shot  09/07/2023*   Medicare Annual Wellness Visit  03/11/2024   DTaP/Tdap/Td vaccine (3 - Tdap) 03/24/2027   Pneumonia Vaccine  Completed   Hepatitis C Screening  Completed   Zoster (Shingles) Vaccine  Completed   HPV Vaccine  Aged Out   Colon Cancer Screening  Discontinued  *Topic was postponed. The date shown is not the original due date.    Next appointment: Follow up in one year for your annual wellness visit.   Preventive Care 22 Years and Older, Male Preventive care refers to lifestyle choices and visits with your health care provider that can promote health and wellness. What does preventive care include? A yearly physical exam. This is also called an annual well check. Dental exams once or twice a year. Routine eye exams. Ask your health care provider how often you should have your eyes checked. Personal lifestyle choices, including: Daily care of your teeth and gums. Regular physical activity. Eating a healthy diet. Avoiding tobacco and drug use. Limiting alcohol use. Practicing safe sex. Taking low doses of aspirin every day. Taking vitamin and mineral supplements as recommended by your health care provider. What happens during an annual well check? The services and screenings done by your health care provider during your annual well check will depend on your age, overall health, lifestyle risk factors, and family history of disease. Counseling  Your health care provider may ask you questions about your: Alcohol use. Tobacco use. Drug use. Emotional  well-being. Home and relationship well-being. Sexual activity. Eating habits. History of falls. Memory and ability to understand (cognition). Work and work Astronomer. Screening  You may have the following tests or measurements: Height, weight, and BMI. Blood pressure. Lipid and cholesterol levels. These may be checked every 5 years, or more frequently if you are over 29 years old. Skin check. Lung cancer screening. You may have this screening every year starting at age 41 if you have a 30-pack-year history of smoking and currently smoke or have quit within the past 15 years. Fecal occult blood test (FOBT) of the stool. You may have this test every year starting at age 57. Flexible sigmoidoscopy or colonoscopy. You may have a sigmoidoscopy every 5 years or a colonoscopy every 10 years starting at age 10. Prostate cancer screening. Recommendations will vary depending on your family history and other risks. Hepatitis C blood test. Hepatitis B blood test. Sexually transmitted disease (STD) testing. Diabetes screening. This is done by checking your blood sugar (glucose) after you have not eaten for a while (fasting). You may have this done every 1-3 years. Abdominal aortic aneurysm (AAA) screening. You may need this if you are a current or former smoker. Osteoporosis. You may be screened starting at age 38 if you are at high risk. Talk with your health care provider about your test results, treatment options, and if necessary, the need for more tests. Vaccines  Your health care provider may recommend certain vaccines, such as: Influenza vaccine. This is recommended every year. Tetanus, diphtheria, and acellular pertussis (Tdap,  Td) vaccine. You may need a Td booster every 10 years. Zoster vaccine. You may need this after age 15. Pneumococcal 13-valent conjugate (PCV13) vaccine. One dose is recommended after age 77. Pneumococcal polysaccharide (PPSV23) vaccine. One dose is recommended after  age 19. Talk to your health care provider about which screenings and vaccines you need and how often you need them. This information is not intended to replace advice given to you by your health care provider. Make sure you discuss any questions you have with your health care provider. Document Released: 06/22/2015 Document Revised: 02/13/2016 Document Reviewed: 03/27/2015 Elsevier Interactive Patient Education  2017 [employer].  Fall Prevention in the Home Falls can cause injuries. They can happen to people of all ages. There are many things you can do to make your home safe and to help prevent falls. What can I do on the outside of my home? Regularly fix the edges of walkways and driveways and fix any cracks. Remove anything that might make you trip as you walk through a door, such as a raised step or threshold. Trim any bushes or trees on the path to your home. Use bright outdoor lighting. Clear any walking paths of anything that might make someone trip, such as rocks or tools. Regularly check to see if handrails are loose or broken. Make sure that both sides of any steps have handrails. Any raised decks and porches should have guardrails on the edges. Have any leaves, snow, or ice cleared regularly. Use sand or salt on walking paths during winter. Clean up any spills in your garage right away. This includes oil or grease spills. What can I do in the bathroom? Use night lights. Install grab bars by the toilet and in the tub and shower. Do not use towel bars as grab bars. Use non-skid mats or decals in the tub or shower. If you need to sit down in the shower, use a plastic, non-slip stool. Keep the floor dry. Clean up any water that spills on the floor as soon as it happens. Remove soap buildup in the tub or shower regularly. Attach bath mats securely with double-sided non-slip rug tape. Do not have throw rugs and other things on the floor that can make you trip. What can I do in the  bedroom? Use night lights. Make sure that you have a light by your bed that is easy to reach. Do not use any sheets or blankets that are too big for your bed. They should not hang down onto the floor. Have a firm chair that has side arms. You can use this for support while you get dressed. Do not have throw rugs and other things on the floor that can make you trip. What can I do in the kitchen? Clean up any spills right away. Avoid walking on wet floors. Keep items that you use a lot in easy-to-reach places. If you need to reach something above you, use a strong step stool that has a grab bar. Keep electrical cords out of the way. Do not use floor polish or wax that makes floors slippery. If you must use wax, use non-skid floor wax. Do not have throw rugs and other things on the floor that can make you trip. What can I do with my stairs? Do not leave any items on the stairs. Make sure that there are handrails on both sides of the stairs and use them. Fix handrails that are broken or loose. Make sure that handrails are as  long as the stairways. Check any carpeting to make sure that it is firmly attached to the stairs. Fix any carpet that is loose or worn. Avoid having throw rugs at the top or bottom of the stairs. If you do have throw rugs, attach them to the floor with carpet tape. Make sure that you have a light switch at the top of the stairs and the bottom of the stairs. If you do not have them, ask someone to add them for you. What else can I do to help prevent falls? Wear shoes that: Do not have high heels. Have rubber bottoms. Are comfortable and fit you well. Are closed at the toe. Do not wear sandals. If you use a stepladder: Make sure that it is fully opened. Do not climb a closed stepladder. Make sure that both sides of the stepladder are locked into place. Ask someone to hold it for you, if possible. Clearly mark and make sure that you can see: Any grab bars or  handrails. First and last steps. Where the edge of each step is. Use tools that help you move around (mobility aids) if they are needed. These include: Canes. Walkers. Scooters. Crutches. Turn on the lights when you go into a dark area. Replace any light bulbs as soon as they burn out. Set up your furniture so you have a clear path. Avoid moving your furniture around. If any of your floors are uneven, fix them. If there are any pets around you, be aware of where they are. Review your medicines with your doctor. Some medicines can make you feel dizzy. This can increase your chance of falling. Ask your doctor what other things that you can do to help prevent falls. This information is not intended to replace advice given to you by your health care provider. Make sure you discuss any questions you have with your health care provider. Document Released: 03/22/2009 Document Revised: 11/01/2015 Document Reviewed: 06/30/2014 Elsevier Interactive Patient Education  2017 [employer].

## 2023-03-12 NOTE — Progress Notes (Signed)
Subjective:   [name] is a 77 y.o. male who presents for Medicare Annual/Subsequent preventive examination.  Visit Complete: Virtual  I connected with  Allena Earing on 03/12/23 by a audio enabled telemedicine application and verified that I am speaking with the correct person using two identifiers.  Patient Location: Home  Provider Location: Office/Clinic  I discussed the limitations of evaluation and management by telemedicine. The patient expressed understanding and agreed to proceed.  Patient Medicare AWV questionnaire was completed by the patient on 03/12/23; I have confirmed that all information answered by patient is correct and no changes since this date.  Cardiac Risk Factors include: advanced age (>33men, >51 women);male gender;dyslipidemia;hypertension     Objective:    Because this visit was a virtual/telehealth visit, some criteria may be missing or patient reported. Any vitals not documented were not able to be obtained and vitals that have been documented are patient reported.       03/12/2023    8:22 AM 02/03/2022    8:22 AM 01/28/2021    9:04 AM 05/08/2020    7:44 AM 01/05/2020    1:03 PM 07/24/2015    1:27 PM 06/07/2015    2:26 PM  Advanced Directives  Does Patient Have a Medical Advance Directive? Yes Yes Yes Yes Yes Yes Yes  Type of Estate agent of [place];Living will Healthcare Power of [place];Living will Healthcare Power of [place];Living will Healthcare Power of [place];Living will Healthcare Power of [place];Living will  Living will;Healthcare Power of Attorney  Does patient want to make changes to medical advance directive? No - Patient declined No - Patient declined  No - Patient declined No - Patient declined    Copy of Healthcare Power of Attorney in Chart? No - copy requested No - copy requested Yes - validated most recent copy scanned in chart (See row information)  Yes - validated most recent copy scanned in  chart (See row information)      Current Medications (verified) Outpatient Encounter Medications as of 03/12/2023  Medication Sig   pantoprazole (PROTONIX) 40 MG tablet Take 1 tablet (40 mg total) by mouth daily before breakfast.   amLODipine (NORVASC) 5 MG tablet Take 1 tablet (5 mg total) by mouth daily.   aspirin 81 MG tablet Take 81 mg by mouth daily.     atorvastatin (LIPITOR) 40 MG tablet Take 1 tablet (40 mg total) by mouth daily.   losartan (COZAAR) 25 MG tablet Take 2 tablets (50 mg total) by mouth daily.   zolpidem (AMBIEN) 5 MG tablet Take 1 tablet (5 mg total) by mouth at bedtime as needed for sleep.   No facility-administered encounter medications on file as of 03/12/2023.    Allergies (verified) Patient has no known allergies.   History: Past Medical History:  Diagnosis Date   Barrett's esophagus    GERD (gastroesophageal reflux disease)    Hyperlipidemia    HYPERTENSION, MILD 02/04/2008   Kidney stones    Prostate nodule    Past Surgical History:  Procedure Laterality Date   APPENDECTOMY     COLONOSCOPY     ESOPHAGOGASTRODUODENOSCOPY     UPPER GASTROINTESTINAL ENDOSCOPY     Family History  Problem Relation Age of Onset   Heart disease Father 37       MI   Heart disease Other        GRANDFATHER   Cancer Maternal Aunt        PANCREATIC   Pancreatic cancer Maternal  Aunt    Breast cancer Mother        dx age 57    Cancer Mother    Hypertension Neg Hx    Diabetes Neg Hx    Colon cancer Neg Hx    Prostate cancer Neg Hx    Colon polyps Neg Hx    Esophageal cancer Neg Hx    Rectal cancer Neg Hx    Stomach cancer Neg Hx    Social History   Socioeconomic History   Marital status: Married    Spouse name: Not on file   Number of children: 1   Years of education: Not on file   Highest education level: Not on file  Occupational History   Occupation: retired Public house manager: [employer]  Tobacco Use   Smoking status: Former    Types:  Cigarettes   Smokeless tobacco: Never   Tobacco comments:    40 years ago   Vaping Use   Vaping status: Never Used  Substance and Sexual Activity   Alcohol use: Yes    Alcohol/week: 4.0 standard drinks of alcohol    Types: 4 Cans of beer per week    Comment: SOCIALLY   Drug use: No   Sexual activity: Not Currently  Other Topics Concern   Not on file  Social History Narrative   Married  , Conservation officer, historic buildings, 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     [place] lives w/ wife   Son is a Chiropractor of Corporate investment banker Strain: Low Risk  (03/12/2023)   Overall Financial Resource Strain (CARDIA)    Difficulty of Paying Living Expenses: Not hard at all  Food Insecurity: No Food Insecurity (03/12/2023)   Hunger Vital Sign    Worried About Running Out of Food in the Last Year: Never true    Ran Out of Food in the Last Year: Never true  Transportation Needs: No Transportation Needs (03/12/2023)   PRAPARE - Administrator, Civil Service (Medical): No    Lack of Transportation (Non-Medical): No  Physical Activity: Sufficiently Active (03/12/2023)   Exercise Vital Sign    Days of Exercise per Week: 5 days    Minutes of Exercise per Session: 40 min  Stress: No Stress Concern Present (03/12/2023)   [employer] of Occupational Health - Occupational Stress Questionnaire    Feeling of Stress : Not at all  Social Connections: Unknown (03/12/2023)   Social Connection and Isolation Panel [NHANES]    Frequency of Communication with Friends and Family: Once a week    Frequency of Social Gatherings with Friends and Family: Twice a week    Attends Religious Services: Not on Marketing executive or Organizations: Yes    Attends Banker Meetings: Never    Marital Status: Married    Tobacco Counseling Counseling given: Not Answered Tobacco comments: 40 years ago    Clinical Intake:  Pre-visit preparation completed: Yes  Pain  : No/denies pain  Nutritional Risks: None Diabetes: No  How often do you need to have someone help you when you read instructions, pamphlets, or other written materials from your doctor or pharmacy?: 1 - Never  Interpreter Needed?: No  Information entered by :: [employer], CMA   Activities of Daily Living    03/12/2023    7:40 AM  In your present state of health, do you have any  difficulty performing the following activities:  Hearing? 0  Vision? 0  Difficulty concentrating or making decisions? 0  Walking or climbing stairs? 0  Dressing or bathing? 0  Doing errands, shopping? 0  Preparing Food and eating ? N  Using the Toilet? N  In the past six months, have you accidently leaked urine? N  Do you have problems with loss of bowel control? N  Managing your Medications? N  Managing your Finances? N  Housekeeping or managing your Housekeeping? N    Patient Care Team: Wanda Plump, MD as PCP - General Leone Payor Maryjean Morn, MD as Consulting Physician (Gastroenterology) Crist Fat, MD as Attending Physician (Urology)  Indicate any recent Medical Services you may have received from other than [facility] providers in the past year (date may be approximate).     Assessment:   This is a routine wellness examination for Marlan.  Hearing/Vision screen No results found.   Goals Addressed   None    Depression Screen    03/12/2023    8:22 AM 04/14/2022    9:50 AM 02/03/2022    8:23 AM 04/08/2021    9:53 AM 01/28/2021    9:07 AM 04/04/2020    8:54 AM 01/05/2020    1:11 PM  PHQ 2/9 Scores  PHQ - 2 Score 0 0 0 0 0 0 0    Fall Risk    03/12/2023    7:40 AM 04/14/2022    9:50 AM 02/03/2022    8:22 AM 04/08/2021    9:53 AM 01/28/2021    9:06 AM  Fall Risk   Falls in the past year? 0 0 0 1 0  Number falls in past yr: 0 0 0 0 0  Injury with Fall? 0 0 0 0 0  Risk for fall due to : No Fall Risks  No Fall Risks    Follow up Falls evaluation completed Falls evaluation completed  Falls evaluation completed Falls evaluation completed Falls prevention discussed    MEDICARE RISK AT HOME: Medicare Risk at Home Any stairs in or around the home?: Yes If so, are there any without handrails?: No Home free of loose throw rugs in walkways, pet beds, electrical cords, etc?: Yes Adequate lighting in your home to reduce risk of falls?: Yes Life alert?: No Use of a cane, walker or w/c?: No Grab bars in the bathroom?: Yes Shower chair or bench in shower?: No Elevated toilet seat or a handicapped toilet?: Yes  TIMED UP AND GO:  Was the test performed?  No    Cognitive Function:        03/12/2023    8:23 AM 02/03/2022    8:27 AM  6CIT Screen  What Year? 0 points 0 points  What month? 0 points 0 points  What time? 0 points 0 points  Count back from 20 0 points 0 points  Months in reverse 0 points 2 points  Repeat phrase 0 points 0 points  Total Score 0 points 2 points    Immunizations Immunization History  Administered Date(s) Administered   Fluad Quad(high Dose 65+) 04/04/2020   Influenza Split 03/12/2011, 03/24/2012, 03/26/2021   Influenza Whole 06/09/2005   Influenza, High Dose Seasonal PF 03/10/2013, 03/15/2015, 02/13/2016, 03/23/2017, 03/29/2018   Influenza,inj,Quad PF,6+ Mos 03/14/2014   Influenza-Unspecified 02/24/2019, 03/07/2022   PFIZER(Purple Top)SARS-COV-2 Vaccination 07/05/2019, 08/02/2019, 03/02/2020, 11/27/2020   PNEUMOCOCCAL CONJUGATE-20 04/14/2022   Pfizer Covid-19 Vaccine Bivalent Booster 44yrs & up 03/26/2021, 03/07/2022   Pneumococcal Conjugate-13 03/14/2014  Pneumococcal Polysaccharide-23 06/09/2004, 03/12/2011   Respiratory Syncytial Virus Vaccine,Recomb Aduvanted(Arexvy) 03/17/2022   Td 06/09/2006, 03/23/2017   Zoster Recombinant(Shingrix) 04/08/2018, 06/10/2018   Zoster, Live 06/04/2010    TDAP status: Up to date  Flu Vaccine status: Due, Education has been provided regarding the importance of this vaccine. Advised may receive  this vaccine at local pharmacy or Health Dept. Aware to provide a copy of the vaccination record if obtained from local pharmacy or Health Dept. Verbalized acceptance and understanding.  Pneumococcal vaccine status: Up to date  Covid-19 vaccine status: Information provided on how to obtain vaccines.   Qualifies for Shingles Vaccine? Yes   Zostavax completed Yes   Shingrix Completed?: Yes  Screening Tests Health Maintenance  Topic Date Due   Medicare Annual Wellness (AWV)  02/04/2023   COVID-19 Vaccine (7 - 2023-24 season) 02/08/2023   INFLUENZA VACCINE  09/07/2023 (Originally 01/08/2023)   DTaP/Tdap/Td (3 - Tdap) 03/24/2027   Pneumonia Vaccine 69+ Years old  Completed   Hepatitis C Screening  Completed   Zoster Vaccines- Shingrix  Completed   HPV VACCINES  Aged Out   Colonoscopy  Discontinued    Health Maintenance  Health Maintenance Due  Topic Date Due   Medicare Annual Wellness (AWV)  02/04/2023   COVID-19 Vaccine (7 - 2023-24 season) 02/08/2023    Colorectal cancer screening: No longer required.   Lung Cancer Screening: (Low Dose CT Chest recommended if Age 32-80 years, 20 pack-year currently smoking OR have quit w/in 15years.) does not qualify.   Additional Screening:  Hepatitis C Screening: does qualify; Completed 03/23/17  Vision Screening: Recommended annual ophthalmology exams for early detection of glaucoma and other disorders of the eye. Is the patient up to date with their annual eye exam?  Yes  Who is the provider or what is the name of the office in which the patient attends annual eye exams? Dr. Vladimir Faster If pt is not established with a provider, would they like to be referred to a provider to establish care? No .   Dental Screening: Recommended annual dental exams for proper oral hygiene  Diabetic Foot Exam: N/a  Community Resource Referral / Chronic Care Management: CRR required this visit?  No   CCM required this visit?  No     Plan:     I have  personally reviewed and noted the following in the patient's chart:   Medical and social history Use of alcohol, tobacco or illicit drugs  Current medications and supplements including opioid prescriptions. Patient is not currently taking opioid prescriptions. Functional ability and status Nutritional status Physical activity Advanced directives List of other physicians Hospitalizations, surgeries, and ER visits in previous 12 months Vitals Screenings to include cognitive, depression, and falls Referrals and appointments  In addition, I have reviewed and discussed with patient certain preventive protocols, quality metrics, and best practice recommendations. A written personalized care plan for preventive services as well as general preventive health recommendations were provided to patient.     Donne Anon, CMA   03/12/2023   After Visit Summary: (MyChart) Due to this being a telephonic visit, the after visit summary with patients personalized plan was offered to patient via MyChart   Nurse Notes: None

## 2023-03-15 ENCOUNTER — Other Ambulatory Visit: Payer: Self-pay | Admitting: Family

## 2023-03-28 ENCOUNTER — Encounter: Payer: Self-pay | Admitting: Internal Medicine

## 2023-03-30 MED ORDER — AMLODIPINE BESYLATE 5 MG PO TABS: 5.0000 mg | ORAL_TABLET | Freq: Every day | ORAL | 1 refills | Status: DC

## 2023-04-20 ENCOUNTER — Encounter: Payer: Self-pay | Admitting: Internal Medicine

## 2023-04-20 ENCOUNTER — Ambulatory Visit (INDEPENDENT_AMBULATORY_CARE_PROVIDER_SITE_OTHER): Payer: Medicare Other | Admitting: Internal Medicine

## 2023-04-20 VITALS — BP 126/80 | HR 73 | Temp 97.9°F | Resp 16 | Ht 71.0 in | Wt 207.0 lb

## 2023-04-20 DIAGNOSIS — I10 Essential (primary) hypertension: Secondary | ICD-10-CM | POA: Diagnosis not present

## 2023-04-20 DIAGNOSIS — Z0000 Encounter for general adult medical examination without abnormal findings: Secondary | ICD-10-CM | POA: Diagnosis not present

## 2023-04-20 DIAGNOSIS — E785 Hyperlipidemia, unspecified: Secondary | ICD-10-CM

## 2023-04-20 DIAGNOSIS — R7309 Other abnormal glucose: Secondary | ICD-10-CM | POA: Diagnosis not present

## 2023-04-20 DIAGNOSIS — Z125 Encounter for screening for malignant neoplasm of prostate: Secondary | ICD-10-CM | POA: Diagnosis not present

## 2023-04-20 LAB — COMPREHENSIVE METABOLIC PANEL
ALT: 20 U/L (ref 0–53)
AST: 18 U/L (ref 0–37)
Albumin: 4.5 g/dL (ref 3.5–5.2)
Alkaline Phosphatase: 69 U/L (ref 39–117)
BUN: 16 mg/dL (ref 6–23)
CO2: 27 meq/L (ref 19–32)
Calcium: 9.5 mg/dL (ref 8.4–10.5)
Chloride: 105 meq/L (ref 96–112)
Creatinine, Ser: 0.82 mg/dL (ref 0.40–1.50)
GFR: 84.74 mL/min (ref >60.00)
Glucose, Bld: 104 mg/dL — ABNORMAL HIGH (ref 70–99)
Potassium: 4.3 meq/L (ref 3.5–5.1)
Sodium: 141 meq/L (ref 135–145)
Total Bilirubin: 0.8 mg/dL (ref 0.2–1.2)
Total Protein: 7.1 g/dL (ref 6.0–8.3)

## 2023-04-20 LAB — CBC WITH DIFFERENTIAL/PLATELET
Basophils Absolute: 0 10*3/uL (ref 0.0–0.1)
Basophils Relative: 0.8 % (ref 0.0–3.0)
Eosinophils Absolute: 0.1 10*3/uL (ref 0.0–0.7)
Eosinophils Relative: 1.7 % (ref 0.0–5.0)
HCT: 46 % (ref 39.0–52.0)
Hemoglobin: 15.5 g/dL (ref 13.0–17.0)
Lymphocytes Relative: 26.5 % (ref 12.0–46.0)
Lymphs Abs: 1.2 10*3/uL (ref 0.7–4.0)
MCHC: 33.7 g/dL (ref 30.0–36.0)
MCV: 93.6 fL (ref 78.0–100.0)
Monocytes Absolute: 0.4 10*3/uL (ref 0.1–1.0)
Monocytes Relative: 9.4 % (ref 3.0–12.0)
Neutro Abs: 2.7 10*3/uL (ref 1.4–7.7)
Neutrophils Relative %: 61.6 % (ref 43.0–77.0)
Platelets: 245 10*3/uL (ref 150.0–400.0)
RBC: 4.91 Mil/uL (ref 4.22–5.81)
RDW: 13.9 % (ref 11.5–15.5)
WBC: 4.4 10*3/uL (ref 4.0–10.5)

## 2023-04-20 LAB — HEMOGLOBIN A1C: Hgb A1c MFr Bld: 5.7 % (ref 4.6–6.5)

## 2023-04-20 LAB — LIPID PANEL
Cholesterol: 114 mg/dL (ref 0–200)
HDL: 40.1 mg/dL (ref >39.00)
LDL Cholesterol: 62 mg/dL (ref 0–99)
NonHDL: 74.31
Total CHOL/HDL Ratio: 3
Triglycerides: 63 mg/dL (ref 0.0–149.0)
VLDL: 12.6 mg/dL (ref 0.0–40.0)

## 2023-04-20 LAB — PSA: PSA: 1.01 ng/mL (ref 0.10–4.00)

## 2023-04-20 NOTE — Progress Notes (Signed)
Subjective:    Patient ID: [name], male    DOB: July 19, 1945, 77 y.o.   MRN: [id number]  DOS:  04/20/2023 Type of visit - description: cpx  Here for CPX Doing great. Somewhat concerned about prostate cancer screening.  Denies any symptoms.  Review of Systems   A 14 point review of systems is negative    Past Medical History:  Diagnosis Date   Barrett's esophagus    GERD (gastroesophageal reflux disease)    Hyperlipidemia    HYPERTENSION, MILD 02/04/2008   Kidney stones    Prostate nodule     Past Surgical History:  Procedure Laterality Date   APPENDECTOMY     COLONOSCOPY     ESOPHAGOGASTRODUODENOSCOPY     UPPER GASTROINTESTINAL ENDOSCOPY     Social History   Socioeconomic History   Marital status: Married    Spouse name: Not on file   Number of children: 1   Years of education: Not on file   Highest education level: Not on file  Occupational History   Occupation: retired Public house manager: [employer]  Tobacco Use   Smoking status: Former    Types: Cigarettes   Smokeless tobacco: Never   Tobacco comments:    40 years ago   Vaping Use   Vaping status: Never Used  Substance and Sexual Activity   Alcohol use: Yes    Alcohol/week: 4.0 standard drinks of alcohol    Types: 4 Cans of beer per week    Comment: SOCIALLY   Drug use: No   Sexual activity: Not Currently  Other Topics Concern   Not on file  Social History Narrative   Married  , Conservation officer, historic buildings, 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     [place] , lives w/ wife   Son is a Chiropractor of Corporate investment banker Strain: Low Risk  (03/12/2023)   Overall Financial Resource Strain (CARDIA)    Difficulty of Paying Living Expenses: Not hard at all  Food Insecurity: No Food Insecurity (03/12/2023)   Hunger Vital Sign    Worried About Running Out of Food in the Last Year: Never true    Ran Out of Food in the Last Year: Never true  Transportation Needs: No  Transportation Needs (03/12/2023)   PRAPARE - Administrator, Civil Service (Medical): No    Lack of Transportation (Non-Medical): No  Physical Activity: Sufficiently Active (03/12/2023)   Exercise Vital Sign    Days of Exercise per Week: 5 days    Minutes of Exercise per Session: 40 min  Stress: No Stress Concern Present (03/12/2023)   [employer] of Occupational Health - Occupational Stress Questionnaire    Feeling of Stress : Not at all  Social Connections: Unknown (03/12/2023)   Social Connection and Isolation Panel [NHANES]    Frequency of Communication with Friends and Family: Once a week    Frequency of Social Gatherings with Friends and Family: Twice a week    Attends Religious Services: Not on Marketing executive or Organizations: Yes    Attends Banker Meetings: Never    Marital Status: Married  Catering manager Violence: Not At Risk (03/12/2023)   Humiliation, Afraid, Rape, and Kick questionnaire    Fear of Current or Ex-Partner: No    Emotionally Abused: No    Physically Abused: No    Sexually Abused: No  Current Outpatient Medications  Medication Instructions   amLODipine (NORVASC) 5 mg, Oral, Daily   aspirin 81 mg, Daily   atorvastatin (LIPITOR) 40 mg, Oral, Daily   losartan (COZAAR) 50 mg, Oral, Daily   pantoprazole (PROTONIX) 40 mg, Oral, Daily before breakfast   zolpidem (AMBIEN) 5 mg, Oral, At bedtime PRN       Objective:   Physical Exam BP 126/80   Pulse 73   Temp 97.9 F (36.6 C) (Oral)   Resp 16   Ht 5\' 11"  (1.803 m)   Wt 207 lb (93.9 kg)   SpO2 97%   BMI 28.87 kg/m  General: Well developed, NAD, BMI noted Neck: No  thyromegaly  HEENT:  Normocephalic . Face symmetric, atraumatic Lungs:  CTA B Normal respiratory effort, no intercostal retractions, no accessory muscle use. Heart: RRR,  no murmur.  Abdomen:  Not distended, soft, non-tender. No rebound or rigidity.   Lower extremities: no pretibial  edema bilaterally  Skin: Exposed areas without rash. Not pale. Not jaundice Neurologic:  alert & oriented X3.  Speech normal, gait appropriate for age and unassisted Strength symmetric and appropriate for age.  Psych: Cognition and judgment appear intact.  Cooperative with normal attention span and concentration.  Behavior appropriate. No anxious or depressed appearing.     Assessment     Assessment  HTN Hyperlipidemia Barrett's esophagus --  EGD 09-2015, + Barrett's, EGD 06-2021, no recall  R Prostate nodule, saw urology ~ 2014 ,small calcification area, no Bx H/o urolithiasis Insomnia Back pain: On and off, saw Dr Ethelene Hal ~08-2014, MRI done, rx conservative treatment +FH CAD father age 66   PLAN:  Here for CPX -Td 03-2017 - s/p RSV per pt - zoster 05/2010; s/p shingrex x2 - pnm 23: 2012;  prevnar: 2015; PNM 20: 2023 -COVID vaccine : plans to get a booster -  had a flu shot already -CCS: had a Cscope 2001 , 02-2010, also 04-2020, no follow-up needed per GI letter. Prostate cancer screening: Last year we talk about stopping screenings, pt and  his wife are somewhat concerned about the issue, no sxs, check a PSA today. + FH CAD: On aspirin, , controlling CV RF Labs: CMP FLP CBC A1c PSA Lifestyle: Very healthy, remains physically active, walks 3 miles daily.  Also engage in a number of activities like shooting old rifles. Healthcare POA: Information provided HTN: Ambulatory BPs in the 120s, on amlodipine and losartan.  Checking labs High cholesterol: On atorvastatin, good compliance.  Labs History of Barrett's: On PPIs, no symptoms Insomnia: Uses Ambien only when he travels. FH CAD: Continue low-dose aspirin RTC 1 year

## 2023-04-20 NOTE — Assessment & Plan Note (Signed)
Here for CPX HTN: Ambulatory BPs in the 120s, on amlodipine and losartan.  Checking labs High cholesterol: On atorvastatin, good compliance.  Labs History of Barrett's: On PPIs, no symptoms Insomnia: Uses Ambien only when he travels. FH CAD: Continue low-dose aspirin RTC 1 year

## 2023-04-20 NOTE — Assessment & Plan Note (Signed)
Here for CPX -Td 03-2017 - s/p RSV per pt - zoster 05/2010; s/p shingrex x2 - pnm 23: 2012;  prevnar: 2015; PNM 20: 2023 -COVID vaccine : plans to get a booster -  had a flu shot already -CCS: had a Cscope 2001 , 02-2010, also 04-2020, no follow-up needed per GI letter. Prostate cancer screening: Last year we talk about stopping screenings, pt and  his wife are somewhat concerned about the issue, no sxs, check a PSA today. + FH CAD: On aspirin, , controlling CV RF Labs: CMP FLP CBC A1c PSA Lifestyle: Very healthy, remains physically active, walks 3 miles daily.  Also engage in a number of activities like shooting old rifles. Healthcare POA: Information provided

## 2023-04-20 NOTE — Patient Instructions (Signed)
  Check the  blood pressure regularly Blood pressure goal:  between 110/65 and  135/85. If it is consistently higher or lower, let me know     GO TO THE LAB : Get the blood work     Next visit with me 1 year for a physical exam    Please schedule it at the front desk        "Health Care Power of attorney" ,  "Living will" (Advance care planning documents)  If you already have a living will or healthcare power of attorney, is recommended you bring the copy to be scanned in your chart.   The document will be available to all the doctors you see in the system.  Advance care planning is a process that supports adults in  understanding and sharing their preferences regarding future medical care.  The patient's preferences are recorded in documents called Advance Directives and the can be modified at any time while the patient is in full mental capacity.   If you don't have one, please consider create one.      More information at: [email or web address]

## 2023-05-07 ENCOUNTER — Other Ambulatory Visit: Payer: Self-pay | Admitting: Internal Medicine

## 2023-05-13 ENCOUNTER — Encounter: Payer: Self-pay | Admitting: Internal Medicine

## 2023-05-19 DIAGNOSIS — K080 Exfoliation of teeth due to systemic causes: Secondary | ICD-10-CM | POA: Diagnosis not present

## 2023-05-20 DIAGNOSIS — H25811 Combined forms of age-related cataract, right eye: Secondary | ICD-10-CM | POA: Diagnosis not present

## 2023-05-20 DIAGNOSIS — H40003 Preglaucoma, unspecified, bilateral: Secondary | ICD-10-CM | POA: Diagnosis not present

## 2023-06-18 ENCOUNTER — Other Ambulatory Visit: Payer: Self-pay | Admitting: Internal Medicine

## 2023-08-13 ENCOUNTER — Other Ambulatory Visit: Payer: Self-pay | Admitting: Internal Medicine

## 2023-09-10 ENCOUNTER — Other Ambulatory Visit: Payer: Self-pay | Admitting: Internal Medicine

## 2023-10-01 DIAGNOSIS — K080 Exfoliation of teeth due to systemic causes: Secondary | ICD-10-CM | POA: Diagnosis not present

## 2023-12-22 DIAGNOSIS — K080 Exfoliation of teeth due to systemic causes: Secondary | ICD-10-CM | POA: Diagnosis not present

## 2023-12-24 ENCOUNTER — Other Ambulatory Visit: Payer: Self-pay | Admitting: Internal Medicine

## 2024-02-06 ENCOUNTER — Other Ambulatory Visit: Payer: Self-pay | Admitting: Internal Medicine

## 2024-02-11 ENCOUNTER — Encounter: Payer: Self-pay | Admitting: Internal Medicine

## 2024-02-12 DIAGNOSIS — Z23 Encounter for immunization: Secondary | ICD-10-CM | POA: Diagnosis not present

## 2024-02-12 DIAGNOSIS — Z7185 Encounter for immunization safety counseling: Secondary | ICD-10-CM | POA: Diagnosis not present

## 2024-03-10 ENCOUNTER — Other Ambulatory Visit: Payer: Self-pay | Admitting: Internal Medicine

## 2024-04-13 ENCOUNTER — Ambulatory Visit (INDEPENDENT_AMBULATORY_CARE_PROVIDER_SITE_OTHER): Admitting: *Deleted

## 2024-04-13 VITALS — Ht 71.0 in | Wt 207.0 lb

## 2024-04-13 DIAGNOSIS — Z0000 Encounter for general adult medical examination without abnormal findings: Secondary | ICD-10-CM | POA: Diagnosis not present

## 2024-04-13 NOTE — Progress Notes (Signed)
 Please attest this visit in the absence of patient primary care provider.    Subjective:   [name] is a 78 y.o. male who presents for a Medicare Annual Wellness Visit.  Allergies (verified) Patient has no known allergies.   History: Past Medical History:  Diagnosis Date   Barrett's esophagus    GERD (gastroesophageal reflux disease)    Hyperlipidemia    HYPERTENSION, MILD 02/04/2008   Kidney stones    Prostate nodule    Past Surgical History:  Procedure Laterality Date   APPENDECTOMY     COLONOSCOPY     ESOPHAGOGASTRODUODENOSCOPY     UPPER GASTROINTESTINAL ENDOSCOPY     Family History  Problem Relation Age of Onset   Heart disease Father 65       MI   Heart disease Other        GRANDFATHER   Cancer Maternal Aunt        PANCREATIC   Pancreatic cancer Maternal Aunt    Breast cancer Mother        dx age 72    Cancer Mother    Hypertension Neg Hx    Diabetes Neg Hx    Colon cancer Neg Hx    Prostate cancer Neg Hx    Colon polyps Neg Hx    Esophageal cancer Neg Hx    Rectal cancer Neg Hx    Stomach cancer Neg Hx    Social History   Occupational History   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[employer]  Tobacco Use   Smoking status: Former    Types: Cigarettes   Smokeless tobacco: Never   Tobacco comments:    40 years ago   Vaping Use   Vaping status: Never Used  Substance and Sexual Activity   Alcohol use: Yes    Alcohol/week: 4.0 standard drinks of alcohol    Types: 4 Cans of beer per week    Comment: SOCIALLY   Drug use: No   Sexual activity: Not Currently   Tobacco Counseling Counseling given: Not Answered Tobacco comments: 40 years ago   SDOH Screenings   Food Insecurity: No Food Insecurity (04/12/2024)  Housing: Low Risk  (04/12/2024)  Transportation Needs: No Transportation Needs (04/12/2024)  Utilities: Not At Risk (04/13/2024)  Alcohol Screen: Low Risk  (04/12/2024)  Depression (PHQ2-9): Low Risk  (04/13/2024)   Financial Resource Strain: Low Risk  (04/12/2024)  Physical Activity: Sufficiently Active (04/12/2024)  Social Connections: Moderately Isolated (04/12/2024)  Stress: No Stress Concern Present (04/12/2024)  Tobacco Use: Medium Risk (04/13/2024)  Health Literacy: Adequate Health Literacy (04/13/2024)   Depression Screen    04/13/2024    8:32 AM 04/20/2023    9:42 AM 03/12/2023    8:22 AM 04/14/2022    9:50 AM 02/03/2022    8:23 AM 04/08/2021    9:53 AM 01/28/2021    9:07 AM  PHQ 2/9 Scores  PHQ - 2 Score 0 0 0 0 0 0 0  PHQ- 9 Score 0           Goals Addressed             This Visit's Progress    COMPLETED: Increase physical activity       To continue travelling         Visit info / Clinical Intake: Medicare Wellness Visit Type:: Subsequent Annual Wellness Visit Medicare Wellness Visit Mode:: Telephone If telephone:: video declined If telephone or video:: vitals recorded from last visit Interpreter  Needed?: No Pre-visit prep was completed: yes AWV questionnaire completed by patient prior to visit?: yes Date:: 04/12/24 Living arrangements:: lives with spouse/significant other Patient's Overall Health Status Rating: excellent Typical amount of pain: some (has issue with intermittent thumb pain) Does pain affect daily life?: no Are you currently prescribed opioids?: no  Dietary Habits and Nutritional Risks How many meals a day?: 2 Eats fruit and vegetables daily?: yes Most meals are obtained by: preparing own meals; eating out Diabetic:: no  Functional Status Activities of Daily Living (to include ambulation/medication): (Patient-Rptd) Independent Ambulation: (Patient-Rptd) Independent Medication Administration: Independent Home Management: (Patient-Rptd) Independent Manage your own finances?: yes Primary transportation is: driving Concerns about vision?: no *vision screening is required for WTM* Concerns about hearing?: no  Fall Screening Falls in the past year?:  (Patient-Rptd) 0 Number of falls in past year: (Patient-Rptd) 0 Was there an injury with Fall?: (Patient-Rptd) 0 Fall Risk Category Calculator: (Patient-Rptd) 0 Patient Fall Risk Level: (Patient-Rptd) Low Fall Risk  Fall Risk Patient at Risk for Falls Due to: No Fall Risks Fall risk Follow up: Education provided  Home and Transportation Safety: All rugs have non-skid backing?: yes All stairs or steps have railings?: yes Grab bars in the bathtub or shower?: yes Have non-skid surface in bathtub or shower?: yes Good home lighting?: yes Regular seat belt use?: yes Hospital stays in the last year:: no  Cognitive Assessment Difficulty concentrating, remembering, or making decisions? : no Will 6CIT or Mini Cog be Completed: yes What year is it?: 0 points What month is it?: 0 points Give patient an address phrase to remember (5 components): [address], [place]  About what time is it?: 0 points Count backwards from 20 to 1: 0 points Say the months of the year in reverse: 0 points Repeat the address phrase from earlier: 2 points 6 CIT Score: 2 points  Advance Directives (For Healthcare) Does Patient Have a Medical Advance Directive?: Yes Does patient want to make changes to medical advance directive?: No - Patient declined Type of Advance Directive: Healthcare Power of [place]; Living will Copy of Healthcare Power of Attorney in Chart?: Yes - validated most recent copy scanned in chart (See row information) Copy of Living Will in Chart?: Yes - validated most recent copy scanned in chart (See row information)  Reviewed/Updated  Reviewed/Updated: All        Objective:    Today's Vitals   04/13/24 0821  Weight: 207 lb (93.9 kg)  Height: 5' 11 (1.803 m)   Body mass index is 28.87 kg/m.  Current Medications (verified) Outpatient Encounter Medications as of 04/13/2024  Medication Sig   amLODipine  (NORVASC ) 5 MG tablet Take 1 tablet (5 mg total) by mouth daily.    aspirin 81 MG tablet Take 81 mg by mouth daily.     atorvastatin  (LIPITOR) 40 MG tablet Take 1 tablet (40 mg total) by mouth daily.   losartan  (COZAAR ) 25 MG tablet Take 2 tablets (50 mg total) by mouth daily.   pantoprazole  (PROTONIX ) 40 MG tablet Take 1 tablet (40 mg total) by mouth daily before breakfast.   zolpidem  (AMBIEN ) 5 MG tablet Take 1 tablet (5 mg total) by mouth at bedtime as needed for sleep.   No facility-administered encounter medications on file as of 04/13/2024.   Hearing/Vision screen Hearing Screening - Comments:: Denies hearing difficulties.  Vision Screening - Comments:: Up to date with routine eye exams with Dr Lonza / Roselie Slack, Ear Nose and Throat Immunizations and Health  Maintenance Health Maintenance  Topic Date Due   COVID-19 Vaccine (10 - 2025-26 season) 04/08/2024   Medicare Annual Wellness (AWV)  04/13/2025   DTaP/Tdap/Td (3 - Tdap) 03/24/2027   Pneumococcal Vaccine: 50+ Years  Completed   Influenza Vaccine  Completed   Hepatitis C Screening  Completed   Zoster Vaccines- Shingrix  Completed   Meningococcal B Vaccine  Aged Out   Colonoscopy  Discontinued        Assessment/Plan:  This is a routine wellness examination for [place].  Patient Care Team: Amon Aloysius BRAVO, MD as PCP - General Avram Lupita BRAVO, MD as Consulting Physician (Gastroenterology) Cam Morene ORN, MD as Attending Physician (Urology) Harvey Lynwood Hacker, MD as Referring Physician (Ophthalmology)  I have personally reviewed and noted the following in the patient's chart:   Medical and social history Use of alcohol, tobacco or illicit drugs  Current medications and supplements including opioid prescriptions. Functional ability and status Nutritional status Physical activity Advanced directives List of other physicians Hospitalizations, surgeries, and ER visits in previous 12 months Vitals Screenings to include cognitive, depression, and falls Referrals and  appointments  No orders of the defined types were placed in this encounter.  In addition, I have reviewed and discussed with patient certain preventive protocols, quality metrics, and best practice recommendations. A written personalized care plan for preventive services as well as general preventive health recommendations were provided to patient.   Lolita Libra, CMA   04/13/2024   Return in 1 year (on 04/13/2025).  After Visit Summary: (MyChart) Due to this being a telephonic visit, the after visit summary with patients personalized plan was offered to patient via MyChart   Nurse Notes: nothing significant to discuss

## 2024-04-13 NOTE — Patient Instructions (Addendum)
 Mr. [name],  Thank you for taking the time for your Medicare Wellness Visit. I appreciate your continued commitment to your health goals. Please review the care plan we discussed, and feel free to reach out if I can assist you further.  Please note that Annual Wellness Visits do not include a physical exam. Some assessments may be limited, especially if the visit was conducted virtually. If needed, we may recommend an in-person follow-up with your provider.  Goals:  Congratulation on completing your goal to increase physical activity. Keep up the good work.   Enjoy your new goal  to continue travelling!  Ongoing Care Seeing your primary care provider every 3 to 6 months helps us  monitor your health and provide consistent, personalized care.   Dr Amon:  06/10/24 2:20pm Annual Wellness Visit: 04/14/25 8:20am, telephone  Recommended Screenings:  Health Maintenance  Topic Date Due   Medicare Annual Wellness Visit  03/11/2024   COVID-19 Vaccine (10 - 2025-26 season) 04/08/2024   DTaP/Tdap/Td vaccine (3 - Tdap) 03/24/2027   Pneumococcal Vaccine for age over 44  Completed   Flu Shot  Completed   Hepatitis C Screening  Completed   Zoster (Shingles) Vaccine  Completed   Meningitis B Vaccine  Aged Out   Colon Cancer Screening  Discontinued       03/12/2023    8:22 AM  Advanced Directives  Does Patient Have a Medical Advance Directive? Yes  Type of Estate Agent of [place];Living will  Does patient want to make changes to medical advance directive? No - Patient declined  Copy of Healthcare Power of Attorney in Chart? No - copy requested    Vision: Annual vision screenings are recommended for early detection of glaucoma, cataracts, and diabetic retinopathy. These exams can also reveal signs of chronic conditions such as diabetes and high blood pressure.  Dental: Annual dental screenings help detect early signs of oral cancer, gum disease, and other conditions linked  to overall health, including heart disease and diabetes.  Please see the attached documents for additional preventive care recommendations.

## 2024-04-13 NOTE — Progress Notes (Signed)
 Please attest this visit in the absence of patient primary care provider.    I connected with  [name] on 04/13/24 by a audio enabled telemedicine application and verified that I am speaking with the correct person using two identifiers.  Patient Location: Home  Provider Location: Office/Clinic  I discussed the limitations of evaluation and management by telemedicine. The patient expressed understanding and agreed to proceed.  Subjective:   [name] is a 78 y.o. male who presents for a Medicare Annual Wellness Visit.  Allergies (verified) Patient has no known allergies.   History: Past Medical History:  Diagnosis Date   Barrett's esophagus    GERD (gastroesophageal reflux disease)    Hyperlipidemia    HYPERTENSION, MILD 02/04/2008   Kidney stones    Prostate nodule    Past Surgical History:  Procedure Laterality Date   APPENDECTOMY     COLONOSCOPY     ESOPHAGOGASTRODUODENOSCOPY     UPPER GASTROINTESTINAL ENDOSCOPY     Family History  Problem Relation Age of Onset   Heart disease Father 55       MI   Heart disease Other        GRANDFATHER   Cancer Maternal Aunt        PANCREATIC   Pancreatic cancer Maternal Aunt    Breast cancer Mother        dx age 93    Cancer Mother    Hypertension Neg Hx    Diabetes Neg Hx    Colon cancer Neg Hx    Prostate cancer Neg Hx    Colon polyps Neg Hx    Esophageal cancer Neg Hx    Rectal cancer Neg Hx    Stomach cancer Neg Hx    Social History   Occupational History   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[employer]  Tobacco Use   Smoking status: Former    Types: Cigarettes   Smokeless tobacco: Never   Tobacco comments:    40 years ago   Vaping Use   Vaping status: Never Used  Substance and Sexual Activity   Alcohol use: Yes    Alcohol/week: 4.0 standard drinks of alcohol    Types: 4 Cans of beer per week    Comment: SOCIALLY   Drug use: No   Sexual activity: Not Currently    Tobacco Counseling Counseling given: Not Answered Tobacco comments: 40 years ago   SDOH Screenings   Food Insecurity: No Food Insecurity (04/12/2024)  Housing: Low Risk  (04/12/2024)  Transportation Needs: No Transportation Needs (04/12/2024)  Utilities: Not At Risk (04/13/2024)  Alcohol Screen: Low Risk  (04/12/2024)  Depression (PHQ2-9): Low Risk  (04/13/2024)  Financial Resource Strain: Low Risk  (04/12/2024)  Physical Activity: Sufficiently Active (04/12/2024)  Social Connections: Moderately Isolated (04/12/2024)  Stress: No Stress Concern Present (04/12/2024)  Tobacco Use: Medium Risk (04/13/2024)  Health Literacy: Adequate Health Literacy (04/13/2024)   Depression Screen    04/13/2024    8:32 AM 04/20/2023    9:42 AM 03/12/2023    8:22 AM 04/14/2022    9:50 AM 02/03/2022    8:23 AM 04/08/2021    9:53 AM 01/28/2021    9:07 AM  PHQ 2/9 Scores  PHQ - 2 Score 0 0 0 0 0 0 0  PHQ- 9 Score 0           Goals Addressed             This Visit's Progress  COMPLETED: Increase physical activity       To continue travelling         Visit info / Clinical Intake: Medicare Wellness Visit Type:: Subsequent Annual Wellness Visit Medicare Wellness Visit Mode:: Telephone If telephone:: video declined If telephone or video:: vitals recorded from last visit Interpreter Needed?: No Pre-visit prep was completed: yes AWV questionnaire completed by patient prior to visit?: yes Date:: 04/12/24 Living arrangements:: lives with spouse/significant other Patient's Overall Health Status Rating: excellent Typical amount of pain: some (has issue with intermittent thumb pain) Does pain affect daily life?: no Are you currently prescribed opioids?: no  Dietary Habits and Nutritional Risks How many meals a day?: 2 Eats fruit and vegetables daily?: yes Most meals are obtained by: preparing own meals; eating out Diabetic:: no  Functional Status Activities of Daily Living (to include  ambulation/medication): (Patient-Rptd) Independent Ambulation: (Patient-Rptd) Independent Medication Administration: Independent Home Management: (Patient-Rptd) Independent Manage your own finances?: yes Primary transportation is: driving Concerns about vision?: no *vision screening is required for WTM* Concerns about hearing?: no  Fall Screening Falls in the past year?: (Patient-Rptd) 0 Number of falls in past year: (Patient-Rptd) 0 Was there an injury with Fall?: (Patient-Rptd) 0 Fall Risk Category Calculator: (Patient-Rptd) 0 Patient Fall Risk Level: (Patient-Rptd) Low Fall Risk  Fall Risk Patient at Risk for Falls Due to: No Fall Risks Fall risk Follow up: Education provided  Home and Transportation Safety: All rugs have non-skid backing?: yes All stairs or steps have railings?: yes Grab bars in the bathtub or shower?: yes Have non-skid surface in bathtub or shower?: yes Good home lighting?: yes Regular seat belt use?: yes Hospital stays in the last year:: no  Cognitive Assessment Difficulty concentrating, remembering, or making decisions? : no Will 6CIT or Mini Cog be Completed: yes What year is it?: 0 points What month is it?: 0 points Give patient an address phrase to remember (5 components): [address] [place]  About what time is it?: 0 points Count backwards from 20 to 1: 0 points Say the months of the year in reverse: 0 points Repeat the address phrase from earlier: 2 points 6 CIT Score: 2 points  Advance Directives (For Healthcare) Does Patient Have a Medical Advance Directive?: Yes Does patient want to make changes to medical advance directive?: No - Patient declined Type of Advance Directive: Healthcare Power of [place]; Living will Copy of Healthcare Power of Attorney in Chart?: Yes - validated most recent copy scanned in chart (See row information) Copy of Living Will in Chart?: Yes - validated most recent copy scanned in chart (See row  information)  Reviewed/Updated  Reviewed/Updated: All        Objective:    Today's Vitals   04/13/24 0821  Weight: 207 lb (93.9 kg)  Height: 5' 11 (1.803 m)   Body mass index is 28.87 kg/m.  Current Medications (verified) Outpatient Encounter Medications as of 04/13/2024  Medication Sig   amLODipine  (NORVASC ) 5 MG tablet Take 1 tablet (5 mg total) by mouth daily.   aspirin 81 MG tablet Take 81 mg by mouth daily.     atorvastatin  (LIPITOR) 40 MG tablet Take 1 tablet (40 mg total) by mouth daily.   losartan  (COZAAR ) 25 MG tablet Take 2 tablets (50 mg total) by mouth daily.   pantoprazole  (PROTONIX ) 40 MG tablet Take 1 tablet (40 mg total) by mouth daily before breakfast.   zolpidem  (AMBIEN ) 5 MG tablet Take 1 tablet (5 mg total) by mouth  at bedtime as needed for sleep.   No facility-administered encounter medications on file as of 04/13/2024.   Hearing/Vision screen Hearing Screening - Comments:: Denies hearing difficulties.  Vision Screening - Comments:: Up to date with routine eye exams with Dr Lonza / Roselie Slack, Ear Nose and Throat Immunizations and Health Maintenance Health Maintenance  Topic Date Due   COVID-19 Vaccine (10 - 2025-26 season) 04/08/2024   Medicare Annual Wellness (AWV)  04/13/2025   DTaP/Tdap/Td (3 - Tdap) 03/24/2027   Pneumococcal Vaccine: 50+ Years  Completed   Influenza Vaccine  Completed   Hepatitis C Screening  Completed   Zoster Vaccines- Shingrix  Completed   Meningococcal B Vaccine  Aged Out   Colonoscopy  Discontinued        Assessment/Plan:  This is a routine wellness examination for [name].  Patient Care Team: Amon Aloysius BRAVO, MD as PCP - General Avram Lupita BRAVO, MD as Consulting Physician (Gastroenterology) Cam Morene ORN, MD as Attending Physician (Urology) Harvey Lynwood Hacker, MD as Referring Physician (Ophthalmology)  I have personally reviewed and noted the following in the patient's chart:   Medical and social  history Use of alcohol, tobacco or illicit drugs  Current medications and supplements including opioid prescriptions. Functional ability and status Nutritional status Physical activity Advanced directives List of other physicians Hospitalizations, surgeries, and ER visits in previous 12 months Vitals Screenings to include cognitive, depression, and falls Referrals and appointments  No orders of the defined types were placed in this encounter.  In addition, I have reviewed and discussed with patient certain preventive protocols, quality metrics, and best practice recommendations. A written personalized care plan for preventive services as well as general preventive health recommendations were provided to patient.   Lolita Libra, CMA   04/13/2024   Return in 1 year (on 04/13/2025).  After Visit Summary: (MyChart) Due to this being a telephonic visit, the after visit summary with patients personalized plan was offered to patient via MyChart   Nurse Notes: nothing significant to discuss

## 2024-04-19 NOTE — Progress Notes (Signed)
 Please attest this visit in the absence of patient primary care provider.    I connected with  [name] on 04/13/24 by a audio enabled telemedicine application and verified that I am speaking with the correct person using two identifiers.  Patient Location: Home  Provider Location: Office/Clinic  I discussed the limitations of evaluation and management by telemedicine. The patient expressed understanding and agreed to proceed.  Subjective:   [name] is a 78 y.o. male who presents for a Medicare Annual Wellness Visit.  Allergies (verified) Patient has no known allergies.   History: Past Medical History:  Diagnosis Date   Barrett's esophagus    GERD (gastroesophageal reflux disease)    Hyperlipidemia    HYPERTENSION, MILD 02/04/2008   Kidney stones    Prostate nodule    Past Surgical History:  Procedure Laterality Date   APPENDECTOMY     COLONOSCOPY     ESOPHAGOGASTRODUODENOSCOPY     UPPER GASTROINTESTINAL ENDOSCOPY     Family History  Problem Relation Age of Onset   Heart disease Father 48       MI   Heart disease Other        GRANDFATHER   Cancer Maternal Aunt        PANCREATIC   Pancreatic cancer Maternal Aunt    Breast cancer Mother        dx age 56    Cancer Mother    Hypertension Neg Hx    Diabetes Neg Hx    Colon cancer Neg Hx    Prostate cancer Neg Hx    Colon polyps Neg Hx    Esophageal cancer Neg Hx    Rectal cancer Neg Hx    Stomach cancer Neg Hx    Social History   Occupational History   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[employer]  Tobacco Use   Smoking status: Former    Types: Cigarettes   Smokeless tobacco: Never   Tobacco comments:    40 years ago   Vaping Use   Vaping status: Never Used  Substance and Sexual Activity   Alcohol use: Yes    Alcohol/week: 4.0 standard drinks of alcohol    Types: 4 Cans of beer per week    Comment: SOCIALLY   Drug use: No   Sexual activity: Not Currently    Tobacco Counseling Counseling given: Not Answered Tobacco comments: 40 years ago   SDOH Screenings   Food Insecurity: No Food Insecurity (04/12/2024)  Housing: Low Risk  (04/12/2024)  Transportation Needs: No Transportation Needs (04/12/2024)  Utilities: Not At Risk (04/13/2024)  Alcohol Screen: Low Risk  (04/12/2024)  Depression (PHQ2-9): Low Risk  (04/13/2024)  Financial Resource Strain: Low Risk  (04/12/2024)  Physical Activity: Sufficiently Active (04/12/2024)  Social Connections: Moderately Isolated (04/12/2024)  Stress: No Stress Concern Present (04/12/2024)  Tobacco Use: Medium Risk (04/13/2024)  Health Literacy: Adequate Health Literacy (04/13/2024)   Depression Screen    04/13/2024    8:32 AM 04/20/2023    9:42 AM 03/12/2023    8:22 AM 04/14/2022    9:50 AM 02/03/2022    8:23 AM 04/08/2021    9:53 AM 01/28/2021    9:07 AM  PHQ 2/9 Scores  PHQ - 2 Score 0 0 0 0 0 0 0  PHQ- 9 Score 0            Data saved with a previous flowsheet row definition     Goals Addressed  This Visit's Progress    COMPLETED: Increase physical activity       To continue travelling         Visit info / Clinical Intake: Medicare Wellness Visit Type:: Subsequent Annual Wellness Visit Persons participating in visit:: patient Medicare Wellness Visit Mode:: Telephone If telephone:: video declined Because this visit was a virtual/telehealth visit:: vitals recorded from last visit Information given by:: patient Interpreter Needed?: No Pre-visit prep was completed: yes AWV questionnaire completed by patient prior to visit?: yes Date:: 04/12/24 Living arrangements:: lives with spouse/significant other Patient's Overall Health Status Rating: excellent Typical amount of pain: some (has issue with intermittent thumb pain) Does pain affect daily life?: no Are you currently prescribed opioids?: no  Dietary Habits and Nutritional Risks How many meals a day?: 2 Eats fruit and vegetables  daily?: yes Most meals are obtained by: preparing own meals; eating out In the last 2 weeks, have you had any of the following?: none Diabetic:: no  Functional Status Activities of Daily Living (to include ambulation/medication): (Patient-Rptd) Independent Ambulation: (Patient-Rptd) Independent Medication Administration: Independent Home Management: (Patient-Rptd) Independent Manage your own finances?: yes Primary transportation is: driving Concerns about vision?: no *vision screening is required for WTM* Concerns about hearing?: no  Fall Screening Falls in the past year?: (Patient-Rptd) 0 Number of falls in past year: (Patient-Rptd) 0 Was there an injury with Fall?: (Patient-Rptd) 0 Fall Risk Category Calculator: (Patient-Rptd) 0 Patient Fall Risk Level: (Patient-Rptd) Low Fall Risk  Fall Risk Patient at Risk for Falls Due to: No Fall Risks Fall risk Follow up: Education provided  Home and Transportation Safety: All rugs have non-skid backing?: yes All stairs or steps have railings?: yes Grab bars in the bathtub or shower?: yes Have non-skid surface in bathtub or shower?: yes Good home lighting?: yes Regular seat belt use?: yes Hospital stays in the last year:: no  Cognitive Assessment Difficulty concentrating, remembering, or making decisions? : no Will 6CIT or Mini Cog be Completed: yes What year is it?: 0 points What month is it?: 0 points Give patient an address phrase to remember (5 components): [address] [place]  About what time is it?: 0 points Count backwards from 20 to 1: 0 points Say the months of the year in reverse: 0 points Repeat the address phrase from earlier: 2 points 6 CIT Score: 2 points  Advance Directives (For Healthcare) Does Patient Have a Medical Advance Directive?: Yes Does patient want to make changes to medical advance directive?: No - Patient declined Type of Advance Directive: Healthcare Power of [place]; Living will Copy of  Healthcare Power of Attorney in Chart?: Yes - validated most recent copy scanned in chart (See row information) Copy of Living Will in Chart?: Yes - validated most recent copy scanned in chart (See row information)  Reviewed/Updated  Reviewed/Updated: All        Objective:    Today's Vitals   04/13/24 0821  Weight: 207 lb (93.9 kg)  Height: 5' 11 (1.803 m)   Body mass index is 28.87 kg/m.  Current Medications (verified) Outpatient Encounter Medications as of 04/13/2024  Medication Sig   amLODipine  (NORVASC ) 5 MG tablet Take 1 tablet (5 mg total) by mouth daily.   aspirin 81 MG tablet Take 81 mg by mouth daily.     atorvastatin  (LIPITOR) 40 MG tablet Take 1 tablet (40 mg total) by mouth daily.   losartan  (COZAAR ) 25 MG tablet Take 2 tablets (50 mg total) by mouth daily.  pantoprazole  (PROTONIX ) 40 MG tablet Take 1 tablet (40 mg total) by mouth daily before breakfast.   zolpidem  (AMBIEN ) 5 MG tablet Take 1 tablet (5 mg total) by mouth at bedtime as needed for sleep.   No facility-administered encounter medications on file as of 04/13/2024.   Hearing/Vision screen Hearing Screening - Comments:: Denies hearing difficulties.  Vision Screening - Comments:: Up to date with routine eye exams with Dr Lonza / Roselie Slack, Ear Nose and Throat Immunizations and Health Maintenance Health Maintenance  Topic Date Due   COVID-19 Vaccine (10 - 2025-26 season) 04/08/2024   Medicare Annual Wellness (AWV)  04/13/2025   DTaP/Tdap/Td (3 - Tdap) 03/24/2027   Pneumococcal Vaccine: 50+ Years  Completed   Influenza Vaccine  Completed   Hepatitis C Screening  Completed   Zoster Vaccines- Shingrix  Completed   Meningococcal B Vaccine  Aged Out   Colonoscopy  Discontinued        Assessment/Plan:  This is a routine wellness examination for Romero.  Patient Care Team: Amon Aloysius BRAVO, MD as PCP - General Avram Lupita BRAVO, MD as Consulting Physician (Gastroenterology) Cam Morene ORN, MD  as Attending Physician (Urology) Harvey Lynwood Hacker, MD as Referring Physician (Ophthalmology)  I have personally reviewed and noted the following in the patient's chart:   Medical and social history Use of alcohol, tobacco or illicit drugs  Current medications and supplements including opioid prescriptions. Functional ability and status Nutritional status Physical activity Advanced directives List of other physicians Hospitalizations, surgeries, and ER visits in previous 12 months Vitals Screenings to include cognitive, depression, and falls Referrals and appointments  No orders of the defined types were placed in this encounter.  In addition, I have reviewed and discussed with patient certain preventive protocols, quality metrics, and best practice recommendations. A written personalized care plan for preventive services as well as general preventive health recommendations were provided to patient.   Lolita Libra, CMA   04/19/2024   Return in 1 year (on 04/13/2025).  After Visit Summary: (MyChart) Due to this being a telephonic visit, the after visit summary with patients personalized plan was offered to patient via MyChart   Nurse Notes: nothing significant to discuss

## 2024-04-20 ENCOUNTER — Encounter: Payer: Medicare Other | Admitting: Internal Medicine

## 2024-05-06 ENCOUNTER — Other Ambulatory Visit: Payer: Self-pay | Admitting: Internal Medicine

## 2024-05-25 DIAGNOSIS — H538 Other visual disturbances: Secondary | ICD-10-CM | POA: Diagnosis not present

## 2024-05-25 DIAGNOSIS — H40003 Preglaucoma, unspecified, bilateral: Secondary | ICD-10-CM | POA: Diagnosis not present

## 2024-05-25 DIAGNOSIS — H25811 Combined forms of age-related cataract, right eye: Secondary | ICD-10-CM | POA: Diagnosis not present

## 2024-06-10 ENCOUNTER — Ambulatory Visit: Admitting: Internal Medicine

## 2024-06-10 ENCOUNTER — Encounter: Payer: Self-pay | Admitting: Internal Medicine

## 2024-06-10 VITALS — BP 126/82 | HR 70 | Temp 97.2°F | Resp 16 | Ht 71.0 in | Wt 216.6 lb

## 2024-06-10 DIAGNOSIS — E785 Hyperlipidemia, unspecified: Secondary | ICD-10-CM

## 2024-06-10 DIAGNOSIS — R7309 Other abnormal glucose: Secondary | ICD-10-CM

## 2024-06-10 DIAGNOSIS — I10 Essential (primary) hypertension: Secondary | ICD-10-CM | POA: Diagnosis not present

## 2024-06-10 DIAGNOSIS — Z0000 Encounter for general adult medical examination without abnormal findings: Secondary | ICD-10-CM | POA: Diagnosis not present

## 2024-06-10 DIAGNOSIS — N402 Nodular prostate without lower urinary tract symptoms: Secondary | ICD-10-CM | POA: Diagnosis not present

## 2024-06-10 NOTE — Patient Instructions (Addendum)
 GO TO THE LAB :  Get the blood work    Then, go to the front desk for the checkout Please make an appointment for a physical exam in 1 year  Check the  blood pressure regularly Blood pressure goal:  between 110/65 and  135/85. If it is consistently higher or lower, let me know     ESSENTIAL HYPERTENSION: Your blood pressure is well-controlled with your current medications. -Continue taking Amlodipine  5 mg orally once a day. -Continue taking Losartan  50 mg orally once a day. -An EKG was performed today: Normal  HYPERLIPIDEMIA: Your cholesterol levels are being managed with Atorvastatin . -Continue taking Atorvastatin  40 mg orally once a day.     GASTROESOPHAGEAL REFLUX DISEASE AND BARRETT'S ESOPHAGUS: You are asymptomatic and your last EGD in 2023 showed no need for short-term follow-up. -Continue taking Pantoprazole  40 mg orally once a day before breakfast.  PROSTATE NODULE: You had a prostate nodule in the past but no symptoms at this time.  Checking your PSA

## 2024-06-10 NOTE — Progress Notes (Signed)
 "  Subjective:    Patient ID: [name], male    DOB: [birth year], 79 y.o.   MRN: [id number]  DOS:  06/10/2024 CPX  Discussed the use of AI scribe software for clinical note transcription with the patient, who gave verbal consent to proceed.  History of Present Illness [name] is a 79 year old male who presents for a CPX and routine follow-up.  General well-being and activity - Feels well and remains active - Regularly walks with a goal of 10,000 steps per day  Possible carpal tunnel syndrome - Concerned about possible carpal tunnel symptoms in one hand - Plans evaluation with a specialist  Cardiovascular symptoms and hypertension - No chest pain or dyspnea - Hypertension treated with amlodipine  and losartan   Gastrointestinal symptoms and barrett's esophagus - No abdominal pain - GERD and Barrett's esophagus treated with pantoprazole  - Asymptomatic - EGD in 2023 showed no findings requiring short-term recall  Genitourinary findings - No urinary symptoms - Asymptomatic prostate nodule   Review of Systems  Other than above, a 14 point review of systems is negative   Past Medical History:  Diagnosis Date   Barrett's esophagus    GERD (gastroesophageal reflux disease)    Hyperlipidemia    HYPERTENSION, MILD 02/04/2008   Kidney stones    Prostate nodule     Past Surgical History:  Procedure Laterality Date   APPENDECTOMY     COLONOSCOPY     ESOPHAGOGASTRODUODENOSCOPY     UPPER GASTROINTESTINAL ENDOSCOPY      Current Outpatient Medications  Medication Instructions   amLODipine  (NORVASC ) 5 mg, Oral, Daily   aspirin 81 mg, Daily   atorvastatin  (LIPITOR) 40 mg, Oral, Daily   losartan  (COZAAR ) 50 mg, Oral, Daily   pantoprazole  (PROTONIX ) 40 mg, Oral, Daily before breakfast       Objective:   Physical Exam BP 126/82   Pulse 70   Temp (!) 97.2 F (36.2 C)   Resp 16   Ht 5' 11 (1.803 m)   Wt 216 lb 9.6 oz (98.2 kg)   SpO2 98%   BMI  30.21 kg/m  General: Well developed, NAD, BMI noted Neck: No  thyromegaly  HEENT:  Normocephalic . Face symmetric, atraumatic Lungs:  CTA B Normal respiratory effort, no intercostal retractions, no accessory muscle use. Heart: RRR,  no murmur.  Abdomen:  Not distended, soft, non-tender. No rebound or rigidity.   Lower extremities: no pretibial edema bilaterally  Skin: Exposed areas without rash. Not pale. Not jaundice Neurologic:  alert & oriented X3.  Speech normal, gait appropriate for age and unassisted Strength symmetric and appropriate for age.  Psych: Cognition and judgment appear intact.  Cooperative with normal attention span and concentration.  Behavior appropriate. No anxious or depressed appearing.     Assessment    Assessment  HTN Hyperlipidemia Barrett's esophagus --  EGD 09-2015, + Barrett's, EGD 06-2021, no recall  R Prostate nodule, saw urology ~ 2014 ,small calcification area, no Bx H/o urolithiasis Insomnia Back pain: On and off, saw Dr Bonner ~08-2014, MRI done, rx conservative treatment +FH CAD father age 53   Assessment & Plan Here for CPX -Td 03-2017 - s/p RSV per pt - zoster 05/2010; s/p shingrex x2 - pnm 23: 2012;  prevnar: 2015; PNM 20: 2023 - had a flu and a COVID vaccine, up to date -CCS: had a Cscope 2001 , 02-2010, also 04-2020, no follow-up needed per GI letter. Prostate cancer screening: No symptoms, h/o prostate  nodule , check PSA. + FH CAD: On aspirin, , controlling CV RF Labs: CMP FLP CBC A1c TSH PSA Lifestyle: Remains very active, take frequent trips, walks 10,000 steps a day.  Other issues HTN Blood pressure well-controlled, continue Amlodipine  5 mg qd and losartan  50 mg daily.   EKG done today: NSR, unchanged from previous Hyperlipidemia Managed with Atorvastatin .  Check FLP FH CAD:  asymptomatic, on aspirin, controlling CV risks H/o GERD-Barrett's esophagus Asymptomatic. Last EGD in 2023 , no recall.  Continue pantoprazole   daily Insomnia: Occasionally uses Ambien  when he travels RTC 1 year     "

## 2024-06-11 LAB — CBC WITH DIFFERENTIAL/PLATELET
Absolute Lymphocytes: 1361 {cells}/uL (ref 850–3900)
Absolute Monocytes: 482 {cells}/uL (ref 200–950)
Basophils Absolute: 50 {cells}/uL (ref 0–200)
Basophils Relative: 0.9 %
Eosinophils Absolute: 151 {cells}/uL (ref 15–500)
Eosinophils Relative: 2.7 %
HCT: 47.4 % (ref 39.4–51.1)
Hemoglobin: 15.7 g/dL (ref 13.2–17.1)
MCH: 30.4 pg (ref 27.0–33.0)
MCHC: 33.1 g/dL (ref 31.6–35.4)
MCV: 91.7 fL (ref 81.4–101.7)
MPV: 9.5 fL (ref 7.5–12.5)
Monocytes Relative: 8.6 %
Neutro Abs: 3556 {cells}/uL (ref 1500–7800)
Neutrophils Relative %: 63.5 %
Platelets: 209 Thousand/uL (ref 140–400)
RBC: 5.17 Million/uL (ref 4.20–5.80)
RDW: 13 % (ref 11.0–15.0)
Total Lymphocyte: 24.3 %
WBC: 5.6 Thousand/uL (ref 3.8–10.8)

## 2024-06-11 LAB — COMPREHENSIVE METABOLIC PANEL WITH GFR
AG Ratio: 1.8 (calc) (ref 1.0–2.5)
ALT: 26 U/L (ref 9–46)
AST: 18 U/L (ref 10–35)
Albumin: 4.6 g/dL (ref 3.6–5.1)
Alkaline phosphatase (APISO): 66 U/L (ref 35–144)
BUN: 17 mg/dL (ref 7–25)
CO2: 25 mmol/L (ref 20–32)
Calcium: 9.3 mg/dL (ref 8.6–10.3)
Chloride: 106 mmol/L (ref 98–110)
Creat: 0.78 mg/dL (ref 0.70–1.28)
Globulin: 2.5 g/dL (ref 1.9–3.7)
Glucose, Bld: 90 mg/dL (ref 65–99)
Potassium: 4.1 mmol/L (ref 3.5–5.3)
Sodium: 139 mmol/L (ref 135–146)
Total Bilirubin: 0.7 mg/dL (ref 0.2–1.2)
Total Protein: 7.1 g/dL (ref 6.1–8.1)
eGFR: 91 mL/min/1.73m2

## 2024-06-11 LAB — LIPID PANEL
Cholesterol: 122 mg/dL
HDL: 44 mg/dL
LDL Cholesterol (Calc): 61 mg/dL
Non-HDL Cholesterol (Calc): 78 mg/dL
Total CHOL/HDL Ratio: 2.8 (calc)
Triglycerides: 85 mg/dL

## 2024-06-11 LAB — PSA: PSA: 0.76 ng/mL

## 2024-06-11 LAB — TSH: TSH: 1.18 m[IU]/L (ref 0.40–4.50)

## 2024-06-11 LAB — HEMOGLOBIN A1C
Hgb A1c MFr Bld: 5.5 %
Mean Plasma Glucose: 111 mg/dL
eAG (mmol/L): 6.2 mmol/L

## 2024-06-12 ENCOUNTER — Encounter: Payer: Self-pay | Admitting: Internal Medicine

## 2024-06-12 ENCOUNTER — Ambulatory Visit: Payer: Self-pay | Admitting: Internal Medicine

## 2024-06-12 NOTE — Assessment & Plan Note (Signed)
 Here for CPX -Td 03-2017 - s/p RSV per pt - zoster 05/2010; s/p shingrex x2 - pnm 23: 2012;  prevnar: 2015; PNM 20: 2023 - had a flu and a COVID vaccine, up to date -CCS: had a Cscope 2001 , 02-2010, also 04-2020, no follow-up needed per GI letter. Prostate cancer screening: No symptoms, h/o prostate nodule , check PSA. + FH CAD: On aspirin, , controlling CV RF Labs: CMP FLP CBC A1c TSH PSA Lifestyle: Remains very active, take frequent trips, walks 10,000 steps a day.

## 2024-06-12 NOTE — Assessment & Plan Note (Signed)
 Here for CPX  Other issues HTN Blood pressure well-controlled, continue Amlodipine  5 mg qd and losartan  50 mg daily.   EKG done today: NSR, unchanged from previous Hyperlipidemia Managed with Atorvastatin .  Check FLP FH CAD:  asymptomatic, on aspirin, controlling CV risks H/o GERD-Barrett's esophagus Asymptomatic. Last EGD in 2023 , no recall.  Continue pantoprazole  daily Insomnia: Occasionally uses Ambien  when he travels RTC 1 year

## 2024-06-17 ENCOUNTER — Other Ambulatory Visit: Payer: Self-pay | Admitting: Internal Medicine

## 2025-04-14 ENCOUNTER — Ambulatory Visit

## 2025-06-12 ENCOUNTER — Encounter: Admitting: Internal Medicine
